# Patient Record
Sex: Male | Born: 1950 | Race: White | Hispanic: No | Marital: Married | Smoking: Former smoker
Health system: Southern US, Community
[De-identification: ages and names within clinical notes are randomized; demographics above are authoritative.]

## PROBLEM LIST (undated history)

## (undated) DIAGNOSIS — E785 Hyperlipidemia, unspecified: Secondary | ICD-10-CM

## (undated) DIAGNOSIS — H6123 Impacted cerumen, bilateral: Secondary | ICD-10-CM

## (undated) DIAGNOSIS — G4730 Sleep apnea, unspecified: Secondary | ICD-10-CM

## (undated) DIAGNOSIS — M20029 Boutonniere deformity of unspecified finger(s): Secondary | ICD-10-CM

## (undated) DIAGNOSIS — D123 Benign neoplasm of transverse colon: Secondary | ICD-10-CM

## (undated) DIAGNOSIS — H9319 Tinnitus, unspecified ear: Secondary | ICD-10-CM

## (undated) DIAGNOSIS — G709 Myoneural disorder, unspecified: Secondary | ICD-10-CM

## (undated) DIAGNOSIS — J45909 Unspecified asthma, uncomplicated: Secondary | ICD-10-CM

## (undated) DIAGNOSIS — Z8619 Personal history of other infectious and parasitic diseases: Secondary | ICD-10-CM

## (undated) HISTORY — DX: Benign neoplasm of transverse colon: D12.3

## (undated) HISTORY — DX: Hyperlipidemia, unspecified: E78.5

## (undated) HISTORY — DX: Boutonniere deformity of unspecified finger(s): M20.029

## (undated) HISTORY — DX: Personal history of other infectious and parasitic diseases: Z86.19

## (undated) HISTORY — DX: Myoneural disorder, unspecified: G70.9

## (undated) HISTORY — PX: COLONOSCOPY: SHX174

---
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
## 1992-08-01 DIAGNOSIS — E781 Pure hyperglyceridemia: Secondary | ICD-10-CM | POA: Insufficient documentation

## 2004-02-01 ENCOUNTER — Ambulatory Visit: Payer: Self-pay | Admitting: General Surgery

## 2004-02-20 ENCOUNTER — Ambulatory Visit: Payer: Self-pay | Admitting: Family Medicine

## 2007-07-25 ENCOUNTER — Ambulatory Visit: Payer: Self-pay | Admitting: Family Medicine

## 2009-02-08 ENCOUNTER — Ambulatory Visit: Payer: Self-pay | Admitting: General Surgery

## 2009-06-01 IMAGING — CR DG CHEST 2V
1 series · 2 of 2 positions shown · non-contrast
Comparison: none

REASON FOR EXAM: Pneumonia
COMMENTS:

PROCEDURE:     KDR - KDXR CHEST PA (OR AP) AND LAT  - July 25, 2007  [DATE]
RESULT:     The lung fields are clear. The heart, mediastinal and osseous
structures show no significant abnormalities. There is degenerative spurring
at multiple levels of the thoracic spine.

[Series 1: view not recorded · 0.17mm/px · 2 of 2 slices shown]
[im 1/2]
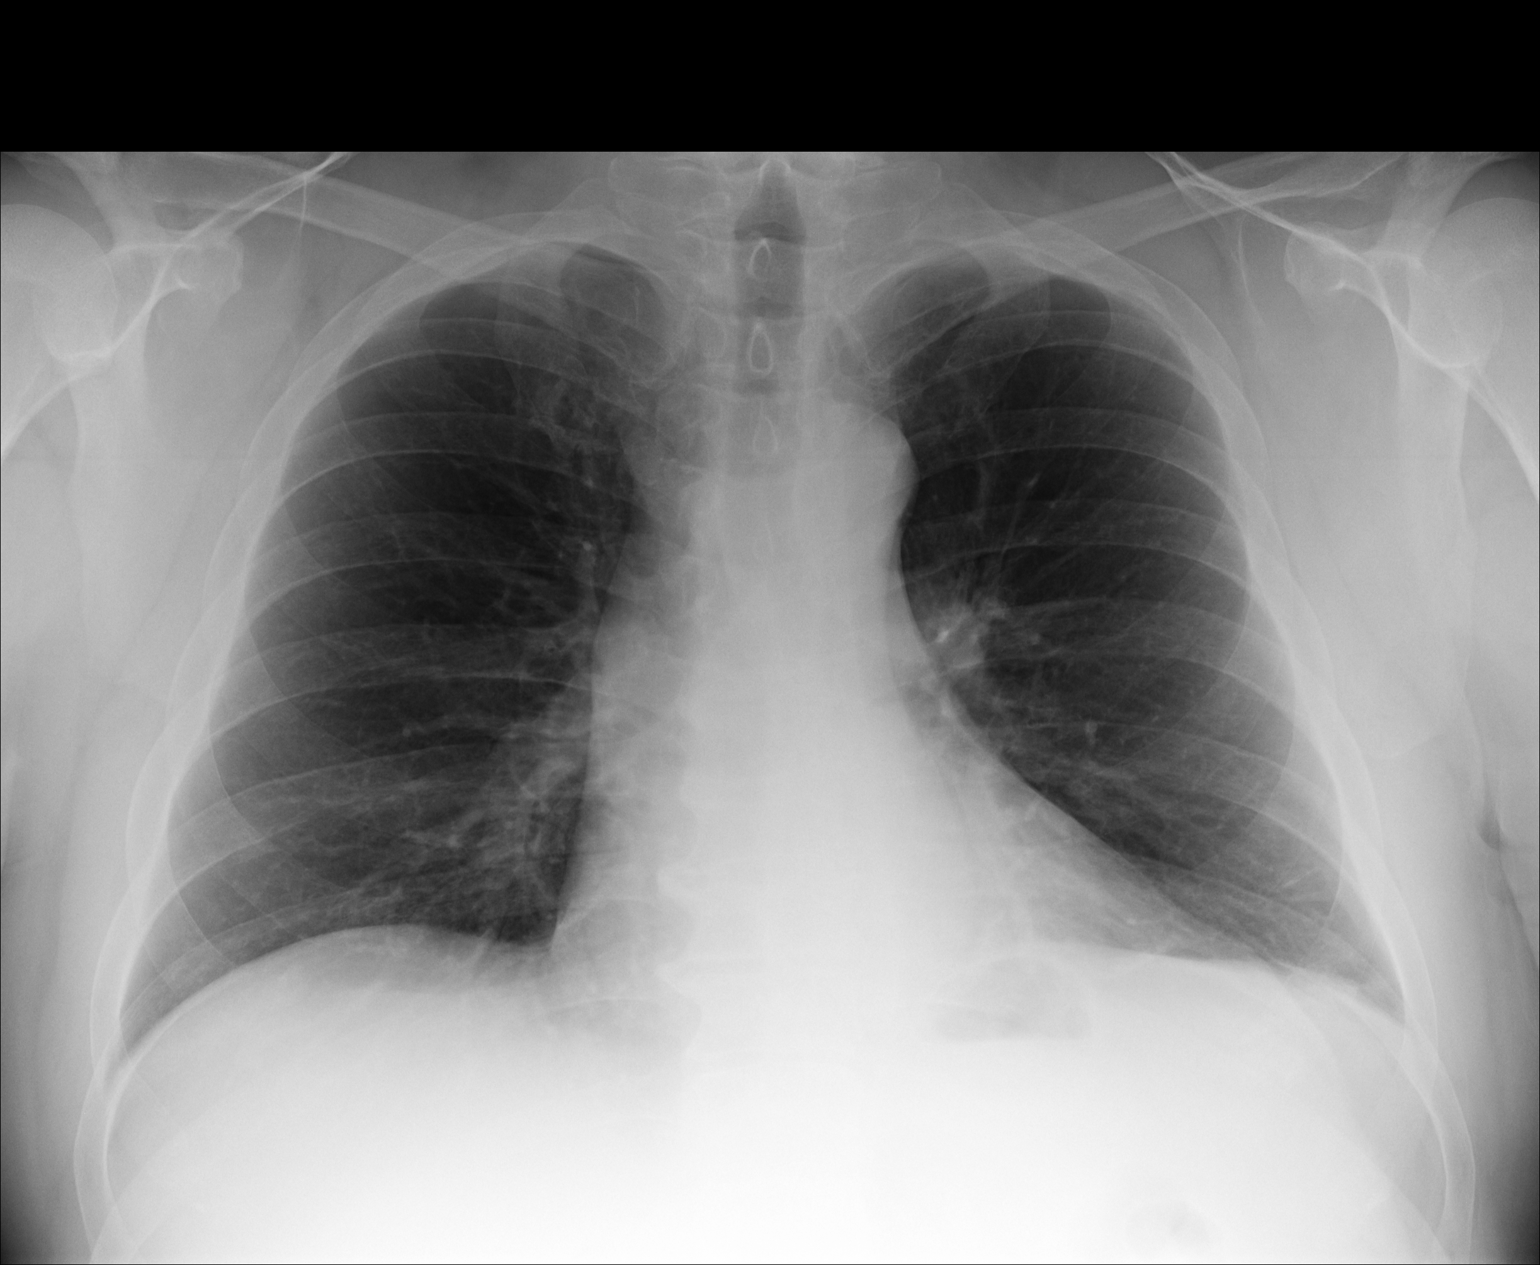
[im 2/2]
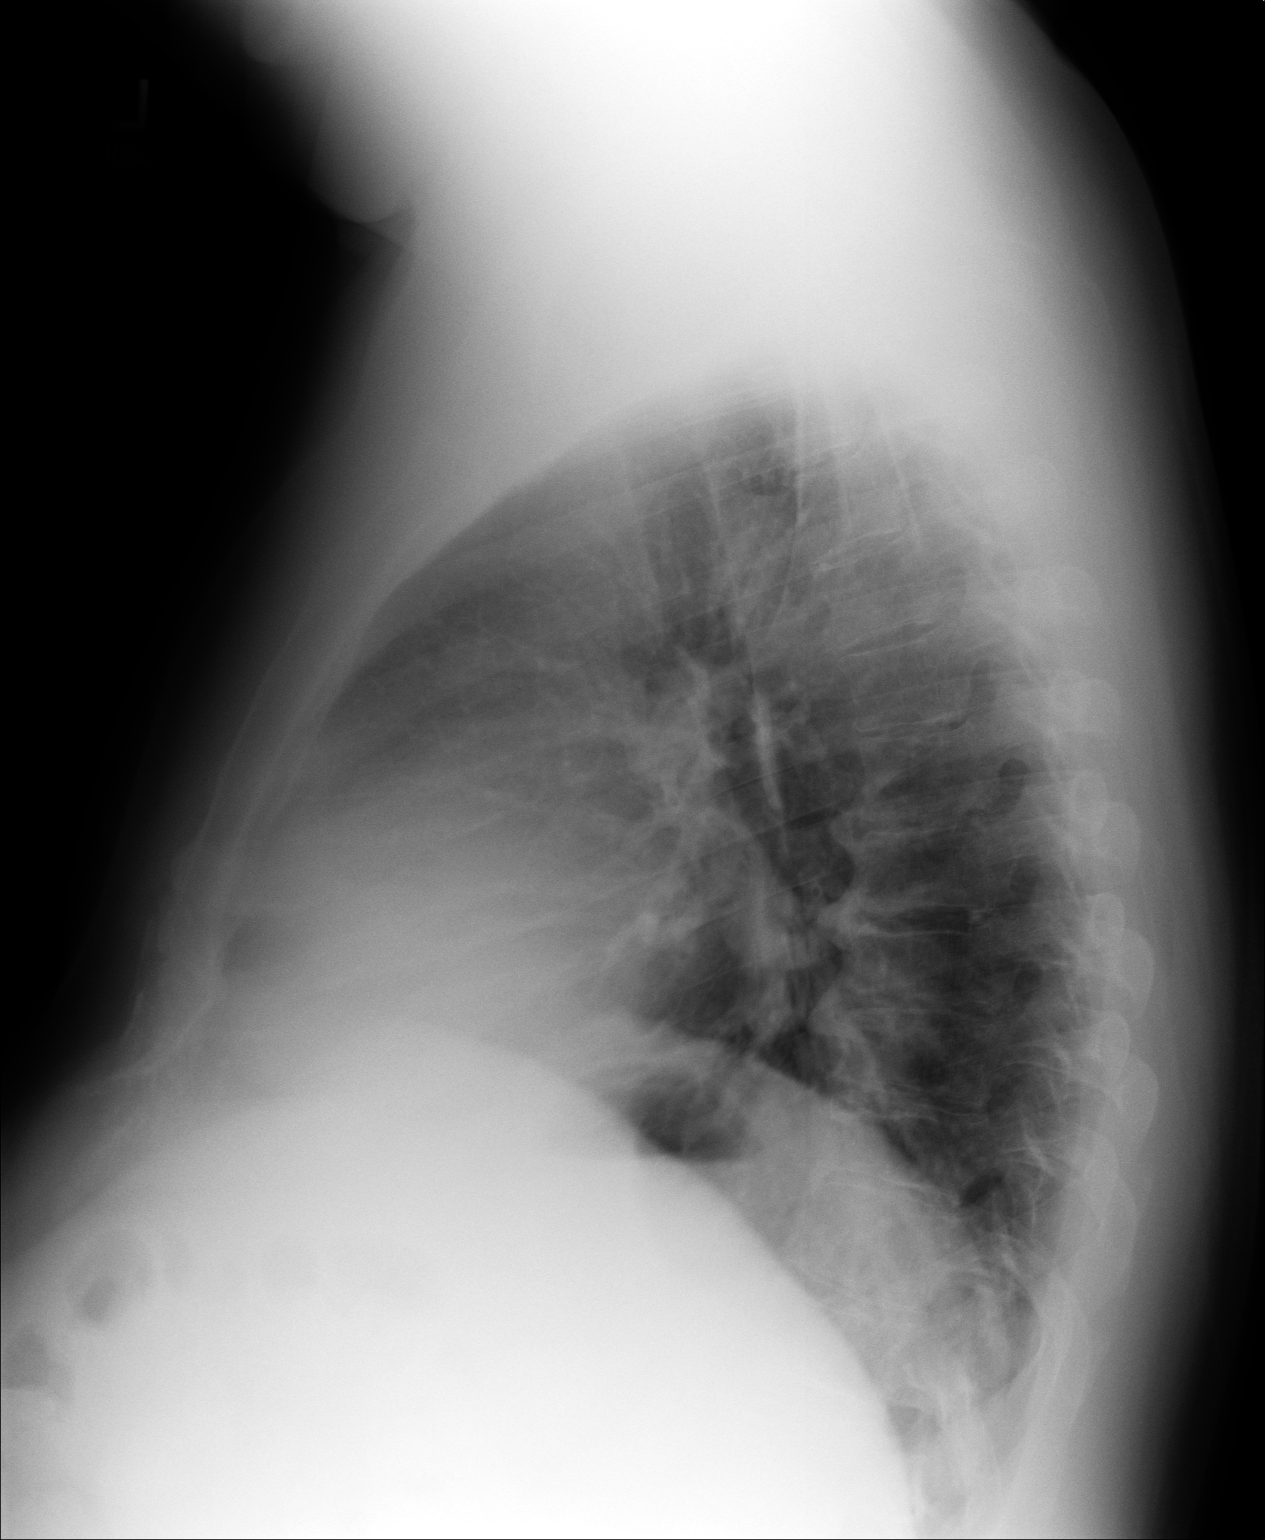

[2 of 2 positions shown; findings below may reference images not displayed]

IMPRESSION: No acute changes are identified.

## 2012-04-28 ENCOUNTER — Telehealth: Payer: Self-pay | Admitting: *Deleted

## 2012-04-28 NOTE — Telephone Encounter (Signed)
Patient called back to confirm no medication changes since last office visit. He is concerned that procedure will not be covered through new insurance. This patient states he has a high deductible. Patient to check with insurance company and let us know if he does not want to proceed.

## 2012-04-28 NOTE — Telephone Encounter (Signed)
I have left message for patient to call the office regarding change in insurance and upcoming colonoscopy that is scheduled for 05-04-12. We just need to verify there have been no medication changes since last office visit. Also, we need to let him know authorization is not required per [facility] for colonoscopy.

## 2012-04-29 ENCOUNTER — Telehealth: Payer: Self-pay | Admitting: *Deleted

## 2012-04-29 NOTE — Telephone Encounter (Signed)
Patient called in today wanting to cancel colonoscopy that was scheduled at [facility] for 05-04-12. This patient states he has a possible job interview and cannot afford to miss it. He will call the office when he is ready to get this rescheduled.

## 2013-04-26 LAB — HM COLONOSCOPY

## 2014-05-09 LAB — TSH: TSH: 2.2 u[IU]/mL (ref 0.41–5.90)

## 2014-05-09 LAB — LIPID PANEL
Cholesterol: 132 mg/dL (ref 0–200)
HDL: 34 mg/dL — AB (ref 35–70)
LDL CALC: 60 mg/dL
Triglycerides: 190 mg/dL — AB (ref 40–160)

## 2014-05-09 LAB — BASIC METABOLIC PANEL
BUN: 16 mg/dL (ref 4–21)
Creatinine: 1.3 mg/dL (ref 0.6–1.3)
GLUCOSE: 87 mg/dL
POTASSIUM: 4.5 mmol/L (ref 3.4–5.3)
Sodium: 143 mmol/L (ref 137–147)

## 2014-11-28 ENCOUNTER — Telehealth: Payer: Self-pay | Admitting: Family Medicine

## 2014-11-28 NOTE — Telephone Encounter (Signed)
Pt states he wants to know when he had his shot last pneumonia vaccine.  CC

## 2014-11-29 NOTE — Telephone Encounter (Signed)
Advised pt that we have no record of him getting a pneumonia vaccine.

## 2015-01-22 ENCOUNTER — Other Ambulatory Visit: Payer: Self-pay | Admitting: Family Medicine

## 2015-02-18 DIAGNOSIS — G4700 Insomnia, unspecified: Secondary | ICD-10-CM | POA: Insufficient documentation

## 2015-02-19 ENCOUNTER — Encounter: Payer: Self-pay | Admitting: Family Medicine

## 2015-02-19 ENCOUNTER — Ambulatory Visit (INDEPENDENT_AMBULATORY_CARE_PROVIDER_SITE_OTHER): Payer: Medicare Other | Admitting: Family Medicine

## 2015-02-19 VITALS — BP 122/70 | HR 88 | Temp 98.2°F | Resp 16

## 2015-02-19 DIAGNOSIS — L409 Psoriasis, unspecified: Secondary | ICD-10-CM | POA: Insufficient documentation

## 2015-02-19 DIAGNOSIS — Z23 Encounter for immunization: Secondary | ICD-10-CM | POA: Diagnosis not present

## 2015-02-19 NOTE — Patient Instructions (Signed)
Apply OTC 1% hydrocortisone as needed

## 2015-02-19 NOTE — Progress Notes (Signed)
       Patient: Earl Crawford Male    DOB: 08/22/1950   65 y.o.   MRN: [id number] Visit Date: 02/19/2015  Today's Provider: Lelon Huh, MD   Chief Complaint  Patient presents with  . Skin Discoloration   Subjective:    HPI Skin check:  Patient comes in stating he has an area that is beet red no the inside of his let butt cheek. Patient denies any pain or itching from this area. His wife notice the spot 2 nights ago as he was bending over in the bathroom. Patient also has an area near his tailbone that itches occasionally and bleeds. Patient states this itchy place has been there intermittenty for several months.      No Known Allergies Previous Medications   ASPIRIN 81 MG TABLET    Take 1 tablet by mouth daily.   ATORVASTATIN (LIPITOR) 20 MG TABLET    Take 1 tablet by mouth daily.   CHOLECALCIFEROL (VITAMIN D3) 5000 UNITS CAPS    Take 1 capsule by mouth daily.   FLUTICASONE (FLONASE) 50 MCG/ACT NASAL SPRAY    Place 2 sprays into both nostrils. 1-2 times a day   MULTIPLE VITAMINS-MINERALS (MULTIVITAMIN ADULT PO)    Take 1 tablet by mouth daily.   OMEGA-3 FATTY ACIDS (FISH OIL) 1200 MG CAPS    Take 1 capsule by mouth daily.   PAROXETINE (PAXIL) 40 MG TABLET    Take 1 tablet by mouth daily.   TRAZODONE (DESYREL) 150 MG TABLET    TAKE ONE-HALF TO ONE TABLET BY MOUTH AT BEDTIME    Review of Systems  Constitutional: Negative for fever, chills and appetite change.  Respiratory: Negative for chest tightness, shortness of breath and wheezing.   Cardiovascular: Negative for chest pain and palpitations.  Gastrointestinal: Negative for nausea, vomiting and abdominal pain.  Skin: Positive for color change and rash.    Social History  Substance Use Topics  . Smoking status: Never Smoker   . Smokeless tobacco: Not on file  . Alcohol Use: 0.0 oz/week    0 Standard drinks or equivalent per week     Comment: occasional use   Objective:   BP 122/70 mmHg  Pulse 88  Temp(Src) 98.2  F (36.8 C) (Oral)  Resp 16  SpO2 96%  Physical Exam  General appearance: alert, well developed, well nourished, cooperative and in no distress  Skin: Patch or psoriasis over sacrum.      Assessment & Plan:     1. Psoriasis   2. Need for pneumococcal vaccination  - Pneumococcal conjugate vaccine 13-valent IM       Lelon Huh, MD  [facility]

## 2015-04-10 DIAGNOSIS — D485 Neoplasm of uncertain behavior of skin: Secondary | ICD-10-CM | POA: Diagnosis not present

## 2015-04-10 DIAGNOSIS — D229 Melanocytic nevi, unspecified: Secondary | ICD-10-CM | POA: Diagnosis not present

## 2015-04-10 DIAGNOSIS — L720 Epidermal cyst: Secondary | ICD-10-CM | POA: Diagnosis not present

## 2015-04-10 DIAGNOSIS — L918 Other hypertrophic disorders of the skin: Secondary | ICD-10-CM | POA: Diagnosis not present

## 2015-04-10 DIAGNOSIS — L719 Rosacea, unspecified: Secondary | ICD-10-CM | POA: Diagnosis not present

## 2015-04-10 DIAGNOSIS — D1800 Hemangioma unspecified site: Secondary | ICD-10-CM | POA: Diagnosis not present

## 2015-04-10 DIAGNOSIS — D692 Other nonthrombocytopenic purpura: Secondary | ICD-10-CM | POA: Diagnosis not present

## 2015-04-10 DIAGNOSIS — L821 Other seborrheic keratosis: Secondary | ICD-10-CM | POA: Diagnosis not present

## 2015-04-10 DIAGNOSIS — Z1283 Encounter for screening for malignant neoplasm of skin: Secondary | ICD-10-CM | POA: Diagnosis not present

## 2015-04-10 DIAGNOSIS — L820 Inflamed seborrheic keratosis: Secondary | ICD-10-CM | POA: Diagnosis not present

## 2015-04-10 DIAGNOSIS — L578 Other skin changes due to chronic exposure to nonionizing radiation: Secondary | ICD-10-CM | POA: Diagnosis not present

## 2015-04-10 DIAGNOSIS — L812 Freckles: Secondary | ICD-10-CM | POA: Diagnosis not present

## 2015-05-01 DIAGNOSIS — H2513 Age-related nuclear cataract, bilateral: Secondary | ICD-10-CM | POA: Diagnosis not present

## 2015-05-13 ENCOUNTER — Encounter: Payer: Self-pay | Admitting: Family Medicine

## 2015-05-13 ENCOUNTER — Ambulatory Visit (INDEPENDENT_AMBULATORY_CARE_PROVIDER_SITE_OTHER): Payer: Medicare Other | Admitting: Family Medicine

## 2015-05-13 VITALS — BP 128/72 | HR 67 | Temp 98.0°F | Resp 16 | Ht 69.5 in | Wt 231.0 lb

## 2015-05-13 DIAGNOSIS — G4733 Obstructive sleep apnea (adult) (pediatric): Secondary | ICD-10-CM | POA: Diagnosis not present

## 2015-05-13 DIAGNOSIS — E669 Obesity, unspecified: Secondary | ICD-10-CM | POA: Diagnosis not present

## 2015-05-13 DIAGNOSIS — F419 Anxiety disorder, unspecified: Secondary | ICD-10-CM | POA: Diagnosis not present

## 2015-05-13 DIAGNOSIS — Z0000 Encounter for general adult medical examination without abnormal findings: Secondary | ICD-10-CM | POA: Diagnosis not present

## 2015-05-13 DIAGNOSIS — Z131 Encounter for screening for diabetes mellitus: Secondary | ICD-10-CM | POA: Diagnosis not present

## 2015-05-13 DIAGNOSIS — E781 Pure hyperglyceridemia: Secondary | ICD-10-CM

## 2015-05-13 DIAGNOSIS — Z125 Encounter for screening for malignant neoplasm of prostate: Secondary | ICD-10-CM | POA: Diagnosis not present

## 2015-05-13 DIAGNOSIS — Z1159 Encounter for screening for other viral diseases: Secondary | ICD-10-CM

## 2015-05-13 DIAGNOSIS — G4700 Insomnia, unspecified: Secondary | ICD-10-CM | POA: Diagnosis not present

## 2015-05-13 NOTE — Progress Notes (Signed)
Patient: Earl Crawford, Male    DOB: November 03, 1950, 65 y.o.   MRN: [id number] Visit Date: 05/13/2015  Today's Provider: Lelon Huh, MD   Chief Complaint  Patient presents with  . Medicare Wellness  . Sleep Apnea    FOLLOW UP  . Hyperlipidemia    FOLLOW UP  . Insomnia    FOLLOW UP   Subjective:   Initial preventative physical exam Earl Crawford is a 65 y.o. male who presents today for his Initial Preventative Physical Exam. He feels well. He reports exercising daily walking the dog for 1 mile. He reports he is sleeping fairly well.   Follow up Obstructive Sleep Apnea:  Last office visit was 1 year ago and no changes were made. Patient was to continue using CPAP consistently. He states he uses CPAP every night and it continues to work well. He feels well rested in the morning and is not sleeping throughout the day.   Follow up insomnia:  Last office visit was 1 year ago and no changes were made. Patient was to continue Trazodone. He states trazodone continues to work well. He also takes paroxetine in the morning which he thinks helps considerable with his anxiety level, which in turn helps him sleep better.     Lipid/Cholesterol, Follow-up:   Last seen for this1 years ago.  Management changes since that visit include none. . Last Lipid Panel:    Component Value Date/Time   CHOL 132 05/09/2014   TRIG 190* 05/09/2014   HDL 34* 05/09/2014   LDLCALC 60 05/09/2014    Risk factors for vascular disease include hypercholesterolemia  He reports good compliance with treatment. He is not having side effects.  Current symptoms include none and have been stable. Weight trend: decreasing steadily Prior visit with dietician: no Current diet: well balanced Current exercise: walking  Wt Readings from Last 3 Encounters:  05/09/14 238 lb (107.956 kg)    -------------------------------------------------------------------   Review of Systems  Constitutional:  Negative for fever, chills, appetite change and fatigue.  HENT: Positive for tinnitus. Negative for congestion, ear pain, hearing loss, nosebleeds and trouble swallowing.   Eyes: Negative for pain and visual disturbance.  Respiratory: Negative for cough, chest tightness and shortness of breath.   Cardiovascular: Negative for chest pain, palpitations and leg swelling.  Gastrointestinal: Negative for nausea, vomiting, abdominal pain, diarrhea, constipation and blood in stool.  Endocrine: Negative for polydipsia, polyphagia and polyuria.  Genitourinary: Negative for dysuria and flank pain.  Musculoskeletal: Positive for arthralgias. Negative for myalgias, back pain, joint swelling and neck stiffness.  Skin: Negative for color change, rash and wound.  Neurological: Positive for numbness (tingling sensation in bottom of right foot and heel) and headaches. Negative for dizziness, tremors, seizures, speech difficulty, weakness and light-headedness.  Psychiatric/Behavioral: Negative for behavioral problems, confusion, sleep disturbance, dysphoric mood and decreased concentration. The patient is not nervous/anxious.   All other systems reviewed and are negative.   Social History   Social History  . Marital Status: Married    Spouse Name: N/A  . Number of Children: 2  . Years of Education: N/A   Occupational History  . 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 Smoking status: Never Smoker   . Smokeless tobacco: Not on file  . Alcohol Use: 0.0 oz/week    0 Standard drinks or equivalent per week     Comment: occasional use  .  Drug Use: No  . Sexual Activity: Not on file   Other Topics Concern  . Not on file   Social History Narrative    Past Medical History  Diagnosis Date  . History of measles   . History of mumps      Patient Active Problem List   Diagnosis Date Noted  . Psoriasis 02/19/2015  . Insomnia 02/18/2015  .  Allergic rhinitis 05/07/2008  . Hypertrophic and atrophic condition of skin 10/21/2006  . Obstructive sleep apnea of adult 12/24/2003  . Tinnitus 11/22/2000  . Pure hyperglyceridemia 08/01/1992    Past Surgical History  Procedure Laterality Date  . Hernia repair  1970    herniorrhapy    His family history includes Diabetes in his other; Heart attack in his father; Hypertension in his father; Macular degeneration in his mother; Obesity in his father. There is no history of Cancer.    Previous Medications   ASPIRIN 81 MG TABLET    Take 1 tablet by mouth daily.   ATORVASTATIN (LIPITOR) 20 MG TABLET    Take 1 tablet by mouth daily.   CHOLECALCIFEROL (VITAMIN D3) 5000 UNITS CAPS    Take 1 capsule by mouth daily.   FLUTICASONE (FLONASE) 50 MCG/ACT NASAL SPRAY    Place 2 sprays into both nostrils. Reported on 05/13/2015   MULTIPLE VITAMINS-MINERALS (MULTIVITAMIN ADULT PO)    Take 1 tablet by mouth daily.   OMEGA-3 FATTY ACIDS (FISH OIL) 1200 MG CAPS    Take 1 capsule by mouth daily.   PAROXETINE (PAXIL) 40 MG TABLET    Take 1 tablet by mouth daily.   TRAZODONE (DESYREL) 150 MG TABLET    TAKE ONE-HALF TO ONE TABLET BY MOUTH AT BEDTIME   VITAMINS-LIPOTROPICS (LIPOFLAVONOID PO)    Take 1 tablet by mouth 3 (three) times daily.    Patient Care Team: Birdie Sons, MD as PCP - General (Family Medicine) Robert Bellow, MD (General Surgery) Roosvelt Harps, OD (Optometry)     Objective:   Vitals: BP 128/72 mmHg  Pulse 67  Temp(Src) 98 F (36.7 C) (Oral)  Resp 16  Ht 5' 9.5" (1.765 m)  Wt 231 lb (104.781 kg)  BMI 33.64 kg/m2  SpO2 98%  Physical Exam   General Appearance:    Alert, cooperative, no distress, appears stated age, overweight.   Head:    Normocephalic, without obvious abnormality, atraumatic  Eyes:    PERRL, conjunctiva/corneas clear, EOM's intact, fundi    benign, both eyes       Ears:    Normal TM's and external ear canals, both ears  Nose:   Nares normal, septum  midline, mucosa normal, no drainage   or sinus tenderness  Throat:   Lips, mucosa, and tongue normal; teeth and gums normal  Neck:   Supple, symmetrical, trachea midline, no adenopathy;       thyroid:  No enlargement/tenderness/nodules; no carotid   bruit or JVD  Back:     Symmetric, no curvature, ROM normal, no CVA tenderness  Lungs:     Clear to auscultation bilaterally, respirations unlabored  Chest wall:    No tenderness or deformity  Heart:    Regular rate and rhythm, S1 and S2 normal, no murmur, rub   or gallop  Abdomen:     Soft, non-tender, bowel sounds active all four quadrants,    no masses, no organomegaly  Genitalia:    deferred  Rectal:    deferred  Extremities:   Extremities  normal, atraumatic, no cyanosis or edema  Pulses:   2+ and symmetric all extremities  Skin:   Skin color, texture, turgor normal, no rashes or lesions  Lymph nodes:   Cervical, supraclavicular, and axillary nodes normal  Neurologic:   CNII-XII intact. Normal strength, sensation and reflexes      throughout      Visual Acuity Screening   Right eye Left eye Both eyes  Without correction: 20/70 20/70 20/70   With correction: 20/25 20/20 20/20   Comments: Saw all colors   Activities of Daily Living In your present state of health, do you have any difficulty performing the following activities: 05/13/2015  Hearing? Y  Vision? N  Difficulty concentrating or making decisions? N  Walking or climbing stairs? N  Dressing or bathing? N  Doing errands, shopping? N    Fall Risk Assessment Fall Risk  05/13/2015  Falls in the past year? No     Depression Screen PHQ 2/9 Scores 05/13/2015  PHQ - 2 Score 0  PHQ- 9 Score 1    Cognitive Testing - 6-CIT  Correct? Score   What year is it? yes 0 0 or 4  What month is it? yes 0 0 or 3  Memorize:    Pia Mau,  42,  [place],      What time is it? (within 1 hour) yes 0 0 or 3  Count backwards from 20 yes 0 0, 2, or 4  Name the months of  the year yes 0 0, 2, or 4  Repeat name & address above yes 0 0, 2, 4, 6, 8, or 10       TOTAL SCORE  0/28   Interpretation:  Normal  Normal (0-7) Abnormal (8-28)    Audit-C Alcohol Use Screening  Question Answer Points  How often do you have alcoholic drink? 2-4 times monthly 2  On days you do drink alcohol, how many drinks do you typically consume? 1 or 2 0  How oftey will you drink 6 or more in a total? never 0  Total Score:  2   A score of 3 or more in women, and 4 or more in men indicates increased risk for alcohol abuse, EXCEPT if all of the points are from question 1.   Current Exercise Habits: Home exercise routine, Type of exercise: walking, Time (Minutes): 30, Frequency (Times/Week): 7, Weekly Exercise (Minutes/Week): 210, Intensity: Mild Exercise limited by: None identified   Assessment & Plan:     Initial Preventative Physical Exam  Reviewed patient's Family Medical History Reviewed and updated list of patient's medical providers Assessment of cognitive impairment was done Assessed patient's functional ability Established a written schedule for health screening Corinne Completed and Reviewed  Exercise Activities and Dietary recommendations Goals    None      Immunization History  Administered Date(s) Administered  . Influenza-Unspecified 11/17/2014  . Pneumococcal Conjugate-13 02/19/2015  . Td 01/23/2000  . Tdap 05/04/2008  . Zoster 02/27/2010    Health Maintenance  Topic Date Due  . Hepatitis C Screening  [birth year]  . HIV Screening  02/10/1965  . INFLUENZA VACCINE  08/20/2015  . PNA vac Low Risk Adult (2 of 2 - PPSV23) 02/19/2016  . COLONOSCOPY  04/26/2016  . TETANUS/TDAP  05/05/2018  . ZOSTAVAX  Completed      Discussed health benefits of physical activity, and encouraged him to engage in regular exercise appropriate for his age and condition.      ------------------------------------------------------------------------------------------------------------  1. Encounter for initial preventive physical examination covered by Medicare Generally doing well with no new medical problems  - EKG 12-Lead  2. Pure hyperglyceridemia He is tolerating atorvastatin well with no adverse effects.   - Hepatic function panel - Lipid panel  3. Obstructive sleep apnea of adult Well controlled and compliant with CPAP  4. Obesity Diet and exercise.   5. Insomnia Trazodone remains effective.   6. Anxiety Doing well with paroxetine.   7. Diabetes mellitus screening  - Glucose  8. Prostate cancer screening  - PSA  9. Need for hepatitis C screening test  - Hepatitis C antibody

## 2015-05-14 ENCOUNTER — Telehealth: Payer: Self-pay | Admitting: Family Medicine

## 2015-05-14 LAB — HEPATIC FUNCTION PANEL
ALT: 20 IU/L (ref 0–44)
AST: 20 IU/L (ref 0–40)
Albumin: 4.7 g/dL (ref 3.6–4.8)
Alkaline Phosphatase: 42 IU/L (ref 39–117)
BILIRUBIN, DIRECT: 0.17 mg/dL (ref 0.00–0.40)
Bilirubin Total: 0.6 mg/dL (ref 0.0–1.2)
Total Protein: 6.8 g/dL (ref 6.0–8.5)

## 2015-05-14 LAB — LIPID PANEL
CHOLESTEROL TOTAL: 128 mg/dL (ref 100–199)
Chol/HDL Ratio: 3.7 ratio units (ref 0.0–5.0)
HDL: 35 mg/dL — ABNORMAL LOW (ref >39)
LDL Calculated: 58 mg/dL (ref 0–99)
Triglycerides: 176 mg/dL — ABNORMAL HIGH (ref 0–149)
VLDL Cholesterol Cal: 35 mg/dL (ref 5–40)

## 2015-05-14 LAB — PSA: PROSTATE SPECIFIC AG, SERUM: 0.4 ng/mL (ref 0.0–4.0)

## 2015-05-14 LAB — HEPATITIS C ANTIBODY

## 2015-05-14 LAB — GLUCOSE, RANDOM: GLUCOSE: 94 mg/dL (ref 65–99)

## 2015-05-14 NOTE — Telephone Encounter (Signed)
Pt stated he spoke with Sharyn Lull about his results and he has another question and would like a call back. Thanks TNP

## 2015-05-14 NOTE — Telephone Encounter (Signed)
Returned pt's call. Patient wanted to go over cholesterol numbers.

## 2015-05-19 ENCOUNTER — Other Ambulatory Visit: Payer: Self-pay | Admitting: Family Medicine

## 2015-05-27 ENCOUNTER — Other Ambulatory Visit: Payer: Self-pay | Admitting: Family Medicine

## 2015-10-21 DIAGNOSIS — Z23 Encounter for immunization: Secondary | ICD-10-CM | POA: Diagnosis not present

## 2015-11-16 ENCOUNTER — Other Ambulatory Visit: Payer: Self-pay | Admitting: Family Medicine

## 2016-04-03 ENCOUNTER — Other Ambulatory Visit: Payer: Self-pay

## 2016-04-03 MED ORDER — ATORVASTATIN CALCIUM 20 MG PO TABS
20.0000 mg | ORAL_TABLET | Freq: Every day | ORAL | 0 refills | Status: DC
Start: 2016-04-03 — End: 2016-09-21

## 2016-04-03 NOTE — Telephone Encounter (Addendum)
Pharmacy requesting 90 day supply

## 2016-04-23 ENCOUNTER — Telehealth: Payer: Self-pay | Admitting: Family Medicine

## 2016-04-23 NOTE — Telephone Encounter (Signed)
Called Pt to schedule AWV with [facility] - knb °

## 2016-05-13 ENCOUNTER — Other Ambulatory Visit: Payer: Self-pay | Admitting: Family Medicine

## 2016-05-18 ENCOUNTER — Ambulatory Visit (INDEPENDENT_AMBULATORY_CARE_PROVIDER_SITE_OTHER): Payer: Medicare Other | Admitting: Family Medicine

## 2016-05-18 ENCOUNTER — Encounter: Payer: Self-pay | Admitting: Family Medicine

## 2016-05-18 VITALS — BP 120/70 | HR 63 | Temp 99.2°F | Resp 16 | Ht 70.0 in | Wt 238.0 lb

## 2016-05-18 DIAGNOSIS — G4733 Obstructive sleep apnea (adult) (pediatric): Secondary | ICD-10-CM

## 2016-05-18 DIAGNOSIS — Z23 Encounter for immunization: Secondary | ICD-10-CM

## 2016-05-18 DIAGNOSIS — Z9989 Dependence on other enabling machines and devices: Secondary | ICD-10-CM | POA: Diagnosis not present

## 2016-05-18 DIAGNOSIS — Z125 Encounter for screening for malignant neoplasm of prostate: Secondary | ICD-10-CM

## 2016-05-18 DIAGNOSIS — Z0000 Encounter for general adult medical examination without abnormal findings: Secondary | ICD-10-CM

## 2016-05-18 DIAGNOSIS — Z86010 Personal history of colonic polyps: Secondary | ICD-10-CM | POA: Diagnosis not present

## 2016-05-18 DIAGNOSIS — Z860101 Personal history of adenomatous and serrated colon polyps: Secondary | ICD-10-CM | POA: Insufficient documentation

## 2016-05-18 DIAGNOSIS — E781 Pure hyperglyceridemia: Secondary | ICD-10-CM

## 2016-05-18 DIAGNOSIS — E669 Obesity, unspecified: Secondary | ICD-10-CM

## 2016-05-18 DIAGNOSIS — Z6833 Body mass index (BMI) 33.0-33.9, adult: Secondary | ICD-10-CM | POA: Diagnosis not present

## 2016-05-18 DIAGNOSIS — H6123 Impacted cerumen, bilateral: Secondary | ICD-10-CM

## 2016-05-18 DIAGNOSIS — G4700 Insomnia, unspecified: Secondary | ICD-10-CM

## 2016-05-18 NOTE — Progress Notes (Signed)
Patient: Earl Crawford, Male    DOB: April 17, 1950, 66 y.o.   MRN: [id number] Visit Date: 05/18/2016  Today's Provider: Lelon Huh, MD   Chief Complaint  Patient presents with  . Medicare Wellness  . Anxiety  . Sleep Apnea  . Insomnia   Subjective:    Annual wellness visit Earl Crawford is a 66 y.o. male. He feels well. He reports exercising; no. He reports he is sleeping fairly well.  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  -----------------------------------------------------------  Pure hyperglyceridemia Lab Results  Component Value Date   CHOL 128 05/13/2015   HDL 35 (L) 05/13/2015   LDLCALC 58 05/13/2015   TRIG 176 (H) 05/13/2015   CHOLHDL 3.7 05/13/2015   Is taking atorvastatin every day and tolerating well.    Obstructive sleep apnea of adult He has been using the same CPAP machine for 15 years and feels like it is not working as well. The mask leaks and he does not feel as well rested.    Insomnia From 05/13/2015-no changes.States that trazodone continues to work well with no lingering sedation in the morning, usually take 1/2 but sometimes a full tablet.   Anxiety Feels that paroxeiting is working well without adverse effect.      Review of Systems  Constitutional: Negative.  Negative for chills, diaphoresis and fever.  HENT: Positive for hearing loss and tinnitus. Negative for congestion, ear discharge, ear pain, nosebleeds and sore throat.   Eyes: Negative for photophobia, pain, discharge and redness.  Respiratory: Positive for apnea. Negative for cough, shortness of breath, wheezing and stridor.   Cardiovascular: Negative for chest pain, palpitations and leg swelling.  Gastrointestinal: Negative for abdominal pain, blood in stool, constipation, diarrhea, nausea and vomiting.  Endocrine: Negative for polydipsia.  Genitourinary: Negative for dysuria, flank pain, frequency, hematuria and  urgency.  Musculoskeletal: Negative for back pain, myalgias and neck pain.  Skin: Negative for rash.  Allergic/Immunologic: Negative for environmental allergies.  Neurological: Negative for dizziness, tremors, seizures, weakness and headaches.  Hematological: Does not bruise/bleed easily.  Psychiatric/Behavioral: Negative for hallucinations and suicidal ideas. The patient is not nervous/anxious.   All other systems reviewed and are negative.   Social History   Social History  . Marital status: Married    Spouse name: N/A  . Number of children: 4  . Years of education: N/A   Occupational History  . 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 Smoking status: Never Smoker  . Smokeless tobacco: Never Used  . Alcohol use 0.0 oz/week     Comment: occasional use  . Drug use: No  . Sexual activity: Not on file   Other Topics Concern  . Not on file   Social History Narrative  . No narrative on file    Past Medical History:  Diagnosis Date  . History of measles   . History of mumps      Patient Active Problem List   Diagnosis Date Noted  . History of adenomatous polyp of colon 05/18/2016  . Obesity 05/13/2015  . Anxiety 05/13/2015  . Psoriasis 02/19/2015  . Insomnia 02/18/2015  . Allergic rhinitis 05/07/2008  . Hypertrophic and atrophic condition of skin 10/21/2006  . OSA on CPAP 12/24/2003  . Tinnitus 11/22/2000  . Pure hyperglyceridemia 08/01/1992    Past Surgical History:  Procedure Laterality Date  .  HERNIA REPAIR  1970   herniorrhapy    His family history includes Diabetes in his other; Heart attack in his father; Hypertension in his father; Macular degeneration in his mother; Obesity in his father.      Current Outpatient Prescriptions:  .  aspirin 81 MG tablet, Take 1 tablet by mouth daily., Disp: , Rfl:  .  atorvastatin (LIPITOR) 20 MG tablet, Take 1 tablet (20 mg total) by mouth daily., Disp: 90 tablet,  Rfl: 0 .  Cholecalciferol (VITAMIN D3) 5000 units CAPS, Take 1 capsule by mouth daily., Disp: , Rfl:  .  Hypromellose (ALZAIR ALLERGY NASAL SPRAY NA), Place into the nose., Disp: , Rfl:  .  Multiple Vitamins-Minerals (MULTIVITAMIN ADULT PO), Take 1 tablet by mouth daily., Disp: , Rfl:  .  Omega-3 Fatty Acids (FISH OIL) 1200 MG CAPS, Take 1 capsule by mouth daily., Disp: , Rfl:  .  PARoxetine (PAXIL) 40 MG tablet, TAKE ONE TABLET BY MOUTH ONCE DAILY, Disp: 90 tablet, Rfl: 3 .  traZODone (DESYREL) 150 MG tablet, TAKE ONE-HALF TO ONE TABLET BY MOUTH AT BEDTIME, Disp: 90 tablet, Rfl: 3 .  Vitamins-Lipotropics (LIPOFLAVONOID PO), Take 1 tablet by mouth 3 (three) times daily., Disp: , Rfl:   Patient Care Team: Birdie Sons, MD as PCP - General (Family Medicine) Robert Bellow, MD (General Surgery) Roosvelt Harps, OD (Optometry)     Objective:   Vitals: BP 120/70 (BP Location: Right Arm, Patient Position: Sitting, Cuff Size: Large)   Pulse 63   Temp 99.2 F (37.3 C) (Oral)   Resp 16   Ht 5\' 10"  (1.778 m)   Wt 238 lb (108 kg)   SpO2 98%   BMI 34.15 kg/m   Physical Exam   General Appearance:    Alert, cooperative, no distress, appears stated age, obese  Head:    Normocephalic, without obvious abnormality, atraumatic  Eyes:    PERRL, conjunctiva/corneas clear, EOM's intact, fundi    benign, both eyes       Ears:    Both canals obstructed with cerumen.   Nose:   Nares normal, septum midline, mucosa normal, no drainage   or sinus tenderness  Throat:   Lips, mucosa, and tongue normal; teeth and gums normal  Neck:   Supple, symmetrical, trachea midline, no adenopathy;       thyroid:  No enlargement/tenderness/nodules; no carotid   bruit or JVD  Back:     Symmetric, no curvature, ROM normal, no CVA tenderness  Lungs:     Clear to auscultation bilaterally, respirations unlabored  Chest wall:    No tenderness or deformity  Heart:    Regular rate and rhythm, S1 and S2 normal, no  murmur, rub   or gallop  Abdomen:     Soft, non-tender, bowel sounds active all four quadrants,    no masses, no organomegaly  Genitalia:    deferred  Rectal:    deferred  Extremities:   Extremities normal, atraumatic, no cyanosis or edema  Pulses:   2+ and symmetric all extremities  Skin:   Skin color, texture, turgor normal, no rashes or lesions  Lymph nodes:   Cervical, supraclavicular, and axillary nodes normal  Neurologic:   CNII-XII intact. Normal strength, sensation and reflexes      throughout    Activities of Daily Living In your present state of health, do you have any difficulty performing the following activities: 05/18/2016  Hearing? Y  Vision? N  Difficulty concentrating or making  decisions? N  Walking or climbing stairs? N  Dressing or bathing? N  Doing errands, shopping? N  Some recent data might be hidden    Fall Risk Assessment Fall Risk  05/18/2016 05/13/2015  Falls in the past year? No No     Depression Screen PHQ 2/9 Scores 05/18/2016 05/13/2015  PHQ - 2 Score 0 0  PHQ- 9 Score 0 1    Cognitive Testing - 6-CIT  Correct? Score   What year is it? yes 0 0 or 4  What month is it? yes 0 0 or 3  Memorize:    Pia Mau,  42,  [address]  [place],      What time is it? (within 1 hour) yes 0 0 or 3  Count backwards from 20 yes 0 0, 2, or 4  Name the months of the year yes 0 0, 2, or 4  Repeat name & address above yes 0 0, 2, 4, 6, 8, or 10       TOTAL SCORE  0/28   Interpretation:  Normal  Normal (0-7) Abnormal (8-28)    Audit-C Alcohol Use Screening  Question Answer Points  How often do you have alcoholic drink? never 0  On days you do drink alcohol, how many drinks do you typically consume? 0 0  How oftey will you drink 6 or more in a total? never 0  Total Score:  0   A score of 3 or more in women, and 4 or more in men indicates increased risk for alcohol abuse, EXCEPT if all of the points are from question 1.     Assessment & Plan:      Annual Wellness Visit  Reviewed patient's Family Medical History Reviewed and updated list of patient's medical providers Assessment of cognitive impairment was done Assessed patient's functional ability Established a written schedule for health screening [place] Completed and Reviewed  Exercise Activities and Dietary recommendations Goals    None      Immunization History  Administered Date(s) Administered  . Influenza-Unspecified 11/17/2014  . Pneumococcal Conjugate-13 02/19/2015  . Td 01/23/2000  . Tdap 05/04/2008  . Zoster 02/27/2010    Health Maintenance  Topic Date Due  . PNA vac Low Risk Adult (2 of 2 - PPSV23) 02/19/2016  . COLONOSCOPY  04/26/2016  . INFLUENZA VACCINE  08/19/2016  . TETANUS/TDAP  05/05/2018  . Hepatitis C Screening  Completed     Discussed health benefits of physical activity, and encouraged him to engage in regular exercise appropriate for his age and condition.    --------------------------------------------------------------------------  1. Medicare annual wellness visit, subsequent Generally doing well. Recommend he talk to his   2. History of adenomatous polyp of colon GI in [place] recommend follow up colonoscopy in 2018 after his last colonoscopy in 04/2013.   - Ambulatory referral to Gastroenterology  3. Pure hyperglyceridemia He is tolerating atorvastatin well with no adverse effects.   - Comprehensive metabolic panel - Lipid panel  4. OSA on CPAP Needs follow up sleep study as current equipment does not seem to control sx anymore.   5. Class 1 obesity without serious comorbidity with body mass index (BMI) of 33.0 to 33.9 in adult, unspecified obesity type D&E  6. Insomnia, unspecified type Continue prn trazodone  7. Need for pneumococcal vaccination - Pneumococcal polysaccharide vaccine 23-valent greater than or equal to 2yo subcutaneous/IM  8. Prostate cancer screening  - PSA  9. Bilateral  impacted cerumen After  soaking with Debrox, ear canal was irrigated with water until clear. Patient tolerated procedure well.  He reports significant improvement in hearing after ear irrigation.   The entirety of the information documented in the History of Present Illness, Review of Systems and Physical Exam were personally obtained by me. Portions of this information were initially documented by April M. Sabra Heck, CMA and reviewed by me for thoroughness and accuracy.    Lelon Huh, MD  [facility]

## 2016-05-18 NOTE — Patient Instructions (Signed)
Check with pharmacist regarding availability of Shinrix vaccine for shingles prevention.

## 2016-05-19 LAB — LIPID PANEL
CHOL/HDL RATIO: 4.2 ratio (ref 0.0–5.0)
Cholesterol, Total: 138 mg/dL (ref 100–199)
HDL: 33 mg/dL — ABNORMAL LOW (ref >39)
LDL CALC: 48 mg/dL (ref 0–99)
Triglycerides: 284 mg/dL — ABNORMAL HIGH (ref 0–149)
VLDL Cholesterol Cal: 57 mg/dL — ABNORMAL HIGH (ref 5–40)

## 2016-05-19 LAB — COMPREHENSIVE METABOLIC PANEL
ALBUMIN: 4.6 g/dL (ref 3.6–4.8)
ALK PHOS: 43 IU/L (ref 39–117)
ALT: 29 IU/L (ref 0–44)
AST: 24 IU/L (ref 0–40)
Albumin/Globulin Ratio: 2.3 — ABNORMAL HIGH (ref 1.2–2.2)
BUN / CREAT RATIO: 18 (ref 10–24)
BUN: 19 mg/dL (ref 8–27)
Bilirubin Total: 0.8 mg/dL (ref 0.0–1.2)
CALCIUM: 9.5 mg/dL (ref 8.6–10.2)
CO2: 25 mmol/L (ref 18–29)
CREATININE: 1.03 mg/dL (ref 0.76–1.27)
Chloride: 103 mmol/L (ref 96–106)
GFR calc Af Amer: 87 mL/min/{1.73_m2} (ref >59)
GFR, EST NON AFRICAN AMERICAN: 75 mL/min/{1.73_m2} (ref >59)
GLOBULIN, TOTAL: 2 g/dL (ref 1.5–4.5)
Glucose: 83 mg/dL (ref 65–99)
Potassium: 4.2 mmol/L (ref 3.5–5.2)
SODIUM: 143 mmol/L (ref 134–144)
Total Protein: 6.6 g/dL (ref 6.0–8.5)

## 2016-05-19 LAB — PSA: Prostate Specific Ag, Serum: 0.4 ng/mL (ref 0.0–4.0)

## 2016-05-22 ENCOUNTER — Telehealth: Payer: Self-pay

## 2016-05-22 ENCOUNTER — Telehealth: Payer: Self-pay | Admitting: Gastroenterology

## 2016-05-22 NOTE — Telephone Encounter (Signed)
Patient called back to schedule procedure. He is off on Monday please call him then. Thanks!

## 2016-05-22 NOTE — Telephone Encounter (Signed)
Patient called wanting to know lab results. Please review. Thanks!

## 2016-05-25 ENCOUNTER — Telehealth: Payer: Self-pay | Admitting: *Deleted

## 2016-05-25 ENCOUNTER — Other Ambulatory Visit: Payer: Self-pay

## 2016-05-25 DIAGNOSIS — Z86010 Personal history of colonic polyps: Secondary | ICD-10-CM

## 2016-05-25 NOTE — Telephone Encounter (Signed)
Patient was inquiring about CPAP equipment. Patient states that he discussed possibly getting new a CPAP at his cpe last week. Please advise?

## 2016-05-25 NOTE — Progress Notes (Signed)
Gastroenterology Pre-Procedure Review  Request Date: Mon May 21st, 2018 Requesting Physician: Dr. Allen Norris  PATIENT REVIEW QUESTIONS: The patient responded to the following health history questions as indicated:    1. Are you having any GI issues? no 2. Do you have a personal history of Polyps? yes (Self) 3. Do you have a family history of Colon Cancer or Polyps? no 4. Diabetes Mellitus? no 5. Joint replacements in the past 12 months?no 6. Major health problems in the past 3 months?no 7. Any artificial heart valves, MVP, or defibrillator?no    MEDICATIONS & ALLERGIES:    Patient reports the following regarding taking any anticoagulation/antiplatelet therapy:   Plavix, Coumadin, Eliquis, Xarelto, Lovenox, Pradaxa, Brilinta, or Effient? no Aspirin? yes  Patient confirms/reports the following medications:  Current Outpatient Prescriptions  Medication Sig Dispense Refill  . aspirin 81 MG tablet Take 1 tablet by mouth daily.    Marland Kitchen atorvastatin (LIPITOR) 20 MG tablet Take 1 tablet (20 mg total) by mouth daily. 90 tablet 0  . Cholecalciferol (VITAMIN D3) 5000 units CAPS Take 1 capsule by mouth daily.    . Hypromellose (ALZAIR ALLERGY NASAL SPRAY NA) Place into the nose.    . Multiple Vitamins-Minerals (MULTIVITAMIN ADULT PO) Take 1 tablet by mouth daily.    . Omega-3 Fatty Acids (FISH OIL) 1200 MG CAPS Take 1 capsule by mouth daily.    Marland Kitchen PARoxetine (PAXIL) 40 MG tablet TAKE ONE TABLET BY MOUTH ONCE DAILY 90 tablet 3  . traZODone (DESYREL) 150 MG tablet TAKE ONE-HALF TO ONE TABLET BY MOUTH AT BEDTIME 90 tablet 3  . Vitamins-Lipotropics (LIPOFLAVONOID PO) Take 1 tablet by mouth 3 (three) times daily.     No current facility-administered medications for this visit.     Patient confirms/reports the following allergies:  No Known Allergies  No orders of the defined types were placed in this encounter.   AUTHORIZATION INFORMATION Primary Insurance: 1D#: Group #:  Secondary  Insurance: 1D#: Group #:  SCHEDULE INFORMATION: Date: Mon May 21st Dr. Allen Norris  Time: Location:[facility]

## 2016-05-25 NOTE — Telephone Encounter (Signed)
Can you please check the status of the sleep study that was ordered 05-18-2016

## 2016-05-26 ENCOUNTER — Telehealth: Payer: Self-pay | Admitting: Gastroenterology

## 2016-05-26 NOTE — Telephone Encounter (Signed)
05/26/16 MCR does NOT require prior Auth. Faxed Prior Auth form to [facility] / [id number].

## 2016-05-26 NOTE — Telephone Encounter (Signed)
Pt advised order for home sleep study faxed to [facility].Their office will contact pt

## 2016-06-01 ENCOUNTER — Telehealth: Payer: Self-pay

## 2016-06-01 NOTE — Telephone Encounter (Signed)
Pt was originally scheduled screening colonoscopy May 21st, 2018 at [facility] with Dr. Allen Norris.  He has requested date change to June 4th. Which we were able to accommodate. I rescheduled at [facility] with Maudie Mercury to June 4th.

## 2016-06-16 ENCOUNTER — Telehealth: Payer: Self-pay | Admitting: Family Medicine

## 2016-06-16 NOTE — Telephone Encounter (Signed)
Pt stated that he spoke with someone at [facility] (where pt gets his CPAP supplies) and they need [place] to fax them orders for an in home sleep study and a new CPAP machine. [facility]'s fax # [phone]. Pt stated that he discussed this with Dr. Caryn Section at his CPE visit on 05/18/16. Please advise. Thanks TNP

## 2016-06-16 NOTE — Telephone Encounter (Signed)
Please clarify. Do they need an order for the sleep study while wearing CPAP? I think he already has a CPAP machine.

## 2016-06-16 NOTE — Telephone Encounter (Signed)
Please review. KW 

## 2016-06-17 ENCOUNTER — Telehealth: Payer: Self-pay | Admitting: Gastroenterology

## 2016-06-17 NOTE — Telephone Encounter (Signed)
LMOVM for pt to return call 

## 2016-06-17 NOTE — Telephone Encounter (Signed)
Patient left a voice message that he needs to move his colonoscopy from June 4 to June 18. Please leave him a message to confirm this because he can't answer his cell phone at work.

## 2016-06-17 NOTE — Telephone Encounter (Signed)
Patient confirmed that he does have a CPAP machine. He states he needs an order for a sleep study to determine if he still needs to use the CPAP machine. The sleep study will be done without using a CPAP machine.  If the sleep study reveals that he does need to still use the CPAP machine, then he needs a new order for CPAP machine also. Patient is requesting an order for both a sleep study and an order for CPAP (just in case he still needs it).

## 2016-06-17 NOTE — Telephone Encounter (Signed)
Notified Kim in [place] to move colonoscoppy from 06/22/16 to 07/06/16 with Dr. Allen Norris still at [facility].  LVM for pt to let him know his appt has been moved as he has requested.

## 2016-06-30 ENCOUNTER — Encounter: Payer: Self-pay | Admitting: *Deleted

## 2016-07-03 NOTE — Discharge Instructions (Signed)
General Anesthesia, Adult, Care After °These instructions provide you with information about caring for yourself after your procedure. Your health care provider may also give you more specific instructions. Your treatment has been planned according to current medical practices, but problems sometimes occur. Call your health care provider if you have any problems or questions after your procedure. °What can I expect after the procedure? °After the procedure, it is common to have: °· Vomiting. °· A sore throat. °· Mental slowness. ° °It is common to feel: °· Nauseous. °· Cold or shivery. °· Sleepy. °· Tired. °· Sore or achy, even in parts of your body where you did not have surgery. ° °Follow these instructions at home: °For at least 24 hours after the procedure: °· Do not: °? Participate in activities where you could fall or become injured. °? Drive. °? Use heavy machinery. °? Drink alcohol. °? Take sleeping pills or medicines that cause drowsiness. °? Make important decisions or sign legal documents. °? Take care of children on your own. °· Rest. °Eating and drinking °· If you vomit, drink water, juice, or soup when you can drink without vomiting. °· Drink enough fluid to keep your urine clear or pale yellow. °· Make sure you have little or no nausea before eating solid foods. °· Follow the diet recommended by your health care provider. °General instructions °· Have a responsible adult stay with you until you are awake and alert. °· Return to your normal activities as told by your health care provider. Ask your health care provider what activities are safe for you. °· Take over-the-counter and prescription medicines only as told by your health care provider. °· If you smoke, do not smoke without supervision. °· Keep all follow-up visits as told by your health care provider. This is important. °Contact a health care provider if: °· You continue to have nausea or vomiting at home, and medicines are not helpful. °· You  cannot drink fluids or start eating again. °· You cannot urinate after 8-12 hours. °· You develop a skin rash. °· You have fever. °· You have increasing redness at the site of your procedure. °Get help right away if: °· You have difficulty breathing. °· You have chest pain. °· You have unexpected bleeding. °· You feel that you are having a life-threatening or urgent problem. °This information is not intended to replace advice given to you by your health care provider. Make sure you discuss any questions you have with your health care provider. °Document Released: 04/13/2000 Document Revised: 06/10/2015 Document Reviewed: 12/20/2014 °Elsevier Interactive Patient Education © 2018 Else[employer] Inc. ° °

## 2016-07-06 ENCOUNTER — Ambulatory Visit
Admission: RE | Admit: 2016-07-06 | Discharge: 2016-07-06 | Disposition: A | Discharge status: Home / Self Care | Payer: Medicare Other | Source: Ambulatory Visit | Attending: Gastroenterology | Admitting: Gastroenterology

## 2016-07-06 ENCOUNTER — Ambulatory Visit: Payer: Medicare Other | Admitting: Anesthesiology

## 2016-07-06 ENCOUNTER — Encounter
Admission: RE | Disposition: A | Discharge status: Home / Self Care | Payer: Self-pay | Source: Ambulatory Visit | Attending: Gastroenterology

## 2016-07-06 DIAGNOSIS — Z7982 Long term (current) use of aspirin: Secondary | ICD-10-CM | POA: Insufficient documentation

## 2016-07-06 DIAGNOSIS — Z86010 Personal history of colonic polyps: Secondary | ICD-10-CM | POA: Insufficient documentation

## 2016-07-06 DIAGNOSIS — Z79899 Other long term (current) drug therapy: Secondary | ICD-10-CM | POA: Diagnosis not present

## 2016-07-06 DIAGNOSIS — Z87891 Personal history of nicotine dependence: Secondary | ICD-10-CM | POA: Insufficient documentation

## 2016-07-06 DIAGNOSIS — Z1211 Encounter for screening for malignant neoplasm of colon: Secondary | ICD-10-CM | POA: Diagnosis not present

## 2016-07-06 DIAGNOSIS — K635 Polyp of colon: Secondary | ICD-10-CM | POA: Diagnosis not present

## 2016-07-06 DIAGNOSIS — G4730 Sleep apnea, unspecified: Secondary | ICD-10-CM | POA: Insufficient documentation

## 2016-07-06 DIAGNOSIS — K5730 Diverticulosis of large intestine without perforation or abscess without bleeding: Secondary | ICD-10-CM | POA: Diagnosis not present

## 2016-07-06 DIAGNOSIS — K640 First degree hemorrhoids: Secondary | ICD-10-CM | POA: Diagnosis not present

## 2016-07-06 DIAGNOSIS — D123 Benign neoplasm of transverse colon: Secondary | ICD-10-CM | POA: Diagnosis not present

## 2016-07-06 HISTORY — PX: COLONOSCOPY WITH PROPOFOL: SHX5780

## 2016-07-06 HISTORY — DX: Sleep apnea, unspecified: G47.30

## 2016-07-06 HISTORY — DX: Tinnitus, unspecified ear: H93.19

## 2016-07-06 HISTORY — DX: Unspecified asthma, uncomplicated: J45.909

## 2016-07-06 HISTORY — PX: POLYPECTOMY: SHX5525

## 2016-07-06 SURGERY — COLONOSCOPY WITH PROPOFOL
Anesthesia: General | Wound class: Contaminated

## 2016-07-06 MED ORDER — STERILE WATER FOR IRRIGATION IR SOLN
Status: DC | PRN
  Administered 2016-07-06: 09:00:00

## 2016-07-06 MED ORDER — OXYCODONE HCL 5 MG/5ML PO SOLN: 5.0000 mg | Freq: Once | ORAL | Status: DC | PRN

## 2016-07-06 MED ORDER — OXYCODONE HCL 5 MG PO TABS: 5.0000 mg | ORAL_TABLET | Freq: Once | ORAL | Status: DC | PRN

## 2016-07-06 MED ORDER — LIDOCAINE HCL (CARDIAC) 20 MG/ML IV SOLN
INTRAVENOUS | Status: DC | PRN
  Administered 2016-07-06: 50 mg via INTRAVENOUS

## 2016-07-06 MED ORDER — PROPOFOL 10 MG/ML IV BOLUS
INTRAVENOUS | Status: DC | PRN
  Administered 2016-07-06: 100 mg via INTRAVENOUS
  Administered 2016-07-06 (×3): 50 mg via INTRAVENOUS

## 2016-07-06 MED ORDER — LACTATED RINGERS IV SOLN
INTRAVENOUS | Status: DC
Start: 2016-07-06 — End: 2016-07-06

## 2016-07-06 MED ORDER — LACTATED RINGERS IV SOLN
INTRAVENOUS | Status: DC | PRN
  Administered 2016-07-06: 09:00:00 via INTRAVENOUS

## 2016-07-06 SURGICAL SUPPLY — 23 items

## 2016-07-06 NOTE — Anesthesia Postprocedure Evaluation (Signed)
Anesthesia Post Note  Patient: Earl Crawford  Procedure(s) Performed: Procedure(s) (LRB): COLONOSCOPY WITH PROPOFOL (N/A) POLYPECTOMY  Patient location during evaluation: PACU Anesthesia Type: General Level of consciousness: awake and alert Pain management: pain level controlled Vital Signs Assessment: post-procedure vital signs reviewed and stable Respiratory status: spontaneous breathing Cardiovascular status: blood pressure returned to baseline Postop Assessment: no headache Anesthetic complications: no    Jaci Standard, III,  Traniya Prichett D

## 2016-07-06 NOTE — H&P (Signed)
Lucilla Lame, MD [address] [place], [place] [address] [phone] Fax : [phone]  Primary Care Physician:  Birdie Sons, MD Primary Gastroenterologist:  Dr. Allen Norris  Pre-Procedure History & Physical: HPI:  Earl Crawford is a 66 y.o. male is here for an colonoscopy.   Past Medical History:  Diagnosis Date  . Asthma    as child  . History of measles   . History of mumps   . Sleep apnea    not currently using CPAP "too noisy"  . Tinnitus     Past Surgical History:  Procedure Laterality Date  . COLONOSCOPY    . HERNIA REPAIR  1970   herniorrhapy    Prior to Admission medications   Medication Sig Start Date End Date Taking? Authorizing Provider  aspirin 81 MG tablet Take 1 tablet by mouth daily. 05/04/08  Yes [provider]  atorvastatin (LIPITOR) 20 MG tablet Take 1 tablet (20 mg total) by mouth daily. 04/03/16  Yes Birdie Sons, MD  Cholecalciferol (VITAMIN D3) 5000 units CAPS Take 1 capsule by mouth daily.   Yes [provider]  Hypromellose Vertell Limber ALLERGY NASAL SPRAY NA) Place into the nose.   Yes [provider]  Multiple Vitamins-Minerals (MULTIVITAMIN ADULT PO) Take 1 tablet by mouth daily. 02/02/06  Yes [provider]  Omega-3 Fatty Acids (FISH OIL) 1200 MG CAPS Take 1 capsule by mouth daily.   Yes [provider]  PARoxetine (PAXIL) 40 MG tablet TAKE ONE TABLET BY MOUTH ONCE DAILY 05/13/16  Yes Birdie Sons, MD  traZODone (DESYREL) 150 MG tablet TAKE ONE-HALF TO ONE TABLET BY MOUTH AT BEDTIME 05/13/16  Yes Birdie Sons, MD    Allergies as of 05/25/2016  . (No Known Allergies)    Family History  Problem Relation Age of Onset  . Macular degeneration Mother   . Heart attack Father   . Hypertension Father   . Obesity Father   . Diabetes Other   . Cancer Neg Hx     Social History   Social History  . Marital status: Married    Spouse name: N/A  . Number of children: 4   . Years of education: N/A   Occupational History  . 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 Smoking status: Former Research scientist (life sciences)  . Smokeless tobacco: Never Used     Comment: 1 year in college  . Alcohol use 0.0 oz/week     Comment: occasional use, 1 drink/mo  . Drug use: No  . Sexual activity: Not on file   Other Topics Concern  . Not on file   Social History Narrative  . No narrative on file    Review of Systems: See HPI, otherwise negative ROS  Physical Exam: BP 114/72   Pulse 83   Temp 98.2 F (36.8 C) (Temporal)   Resp 16   Ht 5\' 10"  (1.778 m)   Wt 228 lb (103.4 kg)   SpO2 96%   BMI 32.71 kg/m  General:   Alert,  pleasant and cooperative in NAD Head:  Normocephalic and atraumatic. Neck:  Supple; no masses or thyromegaly. Lungs:  Clear throughout to auscultation.    Heart:  Regular rate and rhythm. Abdomen:  Soft, nontender and nondistended. Normal bowel sounds, without guarding, and without rebound.   Neurologic:  Alert and  oriented x4;  grossly normal neurologically.  Impression/Plan: Earl Hoyer  Crawford is here for an colonoscopy to be performed for history of colon poylps  Risks, benefits, limitations, and alternatives regarding  colonoscopy have been reviewed with the patient.  Questions have been answered.  All parties agreeable.   Lucilla Lame, MD  07/06/2016, 8:38 AM

## 2016-07-06 NOTE — Transfer of Care (Signed)
Immediate Anesthesia Transfer of Care Note  Patient: Earl Crawford  Procedure(s) Performed: Procedure(s) with comments: COLONOSCOPY WITH PROPOFOL (N/A) - sleep apnea POLYPECTOMY  Patient Location: PACU  Anesthesia Type: General  Level of Consciousness: awake, alert  and patient cooperative  Airway and Oxygen Therapy: Patient Spontanous Breathing and Patient connected to supplemental oxygen  Post-op Assessment: Post-op Vital signs reviewed, Patient's Cardiovascular Status Stable, Respiratory Function Stable, Patent Airway and No signs of Nausea or vomiting  Post-op Vital Signs: Reviewed and stable  Complications: No apparent anesthesia complications

## 2016-07-06 NOTE — Op Note (Addendum)
[facility] Patient Name: Earl Crawford Procedure Date: 07/06/2016 8:39 AM MRN: [id number] Account #: [id number] Date of Birth: 1950/03/16 Admit Type: Outpatient Age: 66 Room: [facility] OR ROOM 01 Gender: Male Note Status: Finalized Procedure:            Colonoscopy Indications:          High risk colon cancer surveillance: Personal history                        of colonic polyps Providers:            Lucilla Lame MD, MD Referring MD:         Kirstie Peri. Caryn Section, MD (Referring MD) Medicines:            Propofol per Anesthesia Complications:        No immediate complications. Procedure:            Pre-Anesthesia Assessment:                       - Prior to the procedure, a History and Physical was                        performed, and patient medications and allergies were                        reviewed. The patient's tolerance of previous                        anesthesia was also reviewed. The risks and benefits of                        the procedure and the sedation options and risks were                        discussed with the patient. All questions were                        answered, and informed consent was obtained. Prior                        Anticoagulants: The patient has taken no previous                        anticoagulant or antiplatelet agents. ASA Grade                        Assessment: II - A patient with mild systemic disease.                        After reviewing the risks and benefits, the patient was                        deemed in satisfactory condition to undergo the                        procedure.                       After obtaining informed consent, the colonoscope was  passed under direct vision. Throughout the procedure,                        the patient's blood pressure, pulse, and oxygen                        saturations were monitored continuously. The [place] [id number]) was introduced through the                        anus and advanced to the the cecum, identified by                        appendiceal orifice and ileocecal valve. The                        colonoscopy was performed without difficulty. The                        patient tolerated the procedure well. The quality of                        the bowel preparation was good. Findings:      The perianal and digital rectal examinations were normal.      Three sessile polyps were found in the transverse colon. The polyps were       3 to 6 mm in size. These polyps were removed with a cold snare.       Resection and retrieval were complete.      Many small-mouthed diverticula were found in the entire colon.      Non-bleeding internal hemorrhoids were found during retroflexion. The       hemorrhoids were moderate and Grade I (internal hemorrhoids that do not       prolapse). Impression:           - Three 3 to 6 mm polyps in the transverse colon,                        removed with a cold snare. Resected and retrieved.                       - Diverticulosis in the entire examined colon.                       - Non-bleeding internal hemorrhoids. Recommendation:       - Discharge patient to home.                       - Resume previous diet.                       - Repeat colonoscopy in 5 years for surveillance. Procedure Code(s):    --- Professional ---                       [phone], Colonoscopy, flexible; with removal of tumor(s),                        polyp(s), or other lesion(s) by snare technique Diagnosis Code(s):    --- Professional ---  Z86.010, Personal history of colonic polyps                       D12.3, Benign neoplasm of transverse colon (hepatic                        flexure or splenic flexure) CPT copyright 2016 American Medical Association. All rights reserved. The codes documented in this report are preliminary and upon coder review  may  be revised to meet current compliance requirements. Lucilla Lame MD, MD 07/06/2016 9:00:03 AM This report has been signed electronically. Number of Addenda: 0 Note Initiated On: 07/06/2016 8:39 AM Scope Withdrawal Time: 0 hours 7 minutes 51 seconds  Total Procedure Duration: 0 hours 10 minutes 25 seconds       [facility]

## 2016-07-06 NOTE — Anesthesia Procedure Notes (Signed)
Performed by: Liane Tribbey Pre-anesthesia Checklist: Patient identified, Emergency Drugs available, Suction available, Timeout performed and Patient being monitored Patient Re-evaluated:Patient Re-evaluated prior to induction Oxygen Delivery Method: Nasal cannula Placement Confirmation: positive ETCO2       

## 2016-07-06 NOTE — Anesthesia Preprocedure Evaluation (Signed)
Anesthesia Evaluation  Patient identified by MRN, date of birth, ID band Patient awake    Reviewed: Allergy & Precautions, H&P , NPO status , Patient's Chart, lab work & pertinent test results  Airway Mallampati: II  TM Distance: >3 FB Neck ROM: full    Dental no notable dental hx.    Pulmonary sleep apnea , former smoker,    Pulmonary exam normal        Cardiovascular Normal cardiovascular exam     Neuro/Psych    GI/Hepatic negative GI ROS, Neg liver ROS,   Endo/Other  negative endocrine ROS  Renal/GU negative Renal ROS     Musculoskeletal   Abdominal   Peds  Hematology negative hematology ROS (+)   Anesthesia Other Findings   Reproductive/Obstetrics                             Anesthesia Physical Anesthesia Plan  ASA: II  Anesthesia Plan: General   Post-op Pain Management:    Induction:   PONV Risk Score and Plan:   Airway Management Planned:   Additional Equipment:   Intra-op Plan:   Post-operative Plan:   Informed Consent:   Plan Discussed with:   Anesthesia Plan Comments:         Anesthesia Quick Evaluation

## 2016-07-07 ENCOUNTER — Encounter: Payer: Self-pay | Admitting: Gastroenterology

## 2016-07-08 ENCOUNTER — Encounter: Payer: Self-pay | Admitting: Gastroenterology

## 2016-07-09 ENCOUNTER — Encounter: Payer: Self-pay | Admitting: Gastroenterology

## 2016-09-21 ENCOUNTER — Other Ambulatory Visit: Payer: Self-pay | Admitting: Family Medicine

## 2016-11-02 ENCOUNTER — Ambulatory Visit (INDEPENDENT_AMBULATORY_CARE_PROVIDER_SITE_OTHER): Payer: Medicare Other | Admitting: Family Medicine

## 2016-11-02 ENCOUNTER — Encounter: Payer: Self-pay | Admitting: Family Medicine

## 2016-11-02 VITALS — BP 144/88 | HR 94 | Temp 98.3°F | Resp 16 | Wt 243.0 lb

## 2016-11-02 DIAGNOSIS — H6123 Impacted cerumen, bilateral: Secondary | ICD-10-CM

## 2016-11-02 DIAGNOSIS — Z23 Encounter for immunization: Secondary | ICD-10-CM | POA: Diagnosis not present

## 2016-11-02 DIAGNOSIS — H6983 Other specified disorders of Eustachian tube, bilateral: Secondary | ICD-10-CM

## 2016-11-02 DIAGNOSIS — J302 Other seasonal allergic rhinitis: Secondary | ICD-10-CM | POA: Diagnosis not present

## 2016-11-02 DIAGNOSIS — H6993 Unspecified Eustachian tube disorder, bilateral: Secondary | ICD-10-CM

## 2016-11-02 HISTORY — DX: Impacted cerumen, bilateral: H61.23

## 2016-11-02 MED ORDER — CETIRIZINE HCL 10 MG PO TABS: 10.0000 mg | ORAL_TABLET | Freq: Every day | ORAL | 11 refills | Status: DC

## 2016-11-02 MED ORDER — FLUTICASONE PROPIONATE 50 MCG/ACT NA SUSP: 2.0000 | Freq: Every day | NASAL | 6 refills | Status: DC

## 2016-11-02 NOTE — Patient Instructions (Signed)
Barotitis Media Barotitis media is inflammation of the middle ear. This condition occurs when an auditory tube (eustachian tube) is blocked in one or both ears. These tubes lead from the middle ear to the back of the nose (nasopharynx). This condition typically occurs when you experience changes in pressure, such as when flying or scuba diving. Untreated barotitis media may lead to damage or hearing loss (barotrauma), which may become permanent. What are the causes? This condition may be caused by changes in air pressure from:  Flying.  Scuba diving.  A nearby explosion. What increases the risk? The following factors may make you more likely to develop this condition:  Middle ear infection.  Sinus infection.  A cold.  Environmental allergies.  Small eustachian tubes.  Recent ear surgery. What are the signs or symptoms? Symptoms of this condition may include:  Ear pain.  Hearing loss. In severe cases, symptoms can include:  Dizziness and nausea (vertigo).  Temporary facial paralysis. How is this diagnosed? This condition is diagnosed based on:  A physical exam. Your health care provider may:  Use a device (otoscope) to look into your ear canal and check your eardrum.  Do a test that changes air pressure in the middle ear to check how well the eardrum moves and to see if the eustachian tube is working(tympanogram).  Your medical history. In some cases, your health care provider may have you take a hearing test. You may also be referred to someone who specializes in ear treatment (otolaryngologist, "ENT"). How is this treated? This condition may be treated with:  Medicines to relieve congestion in your nose, sinus, or upper respiratory tract (decongestants).  Techniques to equalize pressure (to "pop" your ears), such as:  Yawning.  Chewing gum.  Swallowing. In severe cases, you may need surgery to relieve your symptoms or to prevent future inflammation. Follow  these instructions at home:  Take over-the-counter and prescription medicines only as told by your health care provider.  Do not put anything into your ears to clean or unplug them. Ear drops will not help.  Keep all follow-up visits as told by your health care provider. This is important. How is this prevented? Using these strategies may help to prevent barotitis media:  Chewing gum with frequent, forceful swallowing during takeoff and landing when flying.  Holding your nose and gently blowing to pop your ears for equalizing pressure changes. This forces air into the eustachian tube.  Yawning during air pressure changes.  Using a nasal decongestant about 30-60 minutes before flying, if you have nasal congestion. Contact a health care provider if:  You have vertigo.  You have hearing loss.  Your symptoms do not get better or they get worse.  You have a fever. Get help right away if:  You have a severe headache, ear pain, and dizziness.  You have balance problems.  You cannot move or feel part of your face.  You have bloody or pus-like drainage from your ears. Summary  Barotitis media is inflammation of the middle ear.  This condition typically occurs when you experience changes in pressure, such as when flying or scuba diving.  You may be at a higher risk for this condition if you have small eustachian tubes, had recent ear surgery, or have allergies, a cold, or sinus or middle ear infection.  This condition may be treated with medicines or techniques to equalize pressure in your ears.  Strategies can be used to help prevent barotitis media. This information is   not intended to replace advice given to you by your health care provider. Make sure you discuss any questions you have with your health care provider. Document Released: 01/03/2000 Document Revised: 11/25/2015 Document Reviewed: 11/25/2015 Elsevier Interactive Patient Education  2017 Elsevier Inc.  

## 2016-11-02 NOTE — Progress Notes (Signed)
Patient: Earl Crawford Male    DOB: 1950-08-07   66 y.o.   MRN: [id number] Visit Date: 11/02/2016  Today's Provider: Lavon Paganini, MD   Chief Complaint  Patient presents with  . Sinus Problem   Subjective:    Sinus Problem  This is a new problem. Episode onset: x 2 weeks. The problem is unchanged. There has been no fever. He is experiencing no pain. Associated symptoms include congestion, coughing (productive of clear sputum) and ear pain (ear congestion; pt states he "has a tendency to produce excess ear wax", and Dr. Caryn Section states this needs to be irrigated q 6  months. Last irrigated 6 months ago). Pertinent negatives include no chills, diaphoresis, headaches, hoarse voice, neck pain, shortness of breath, sinus pressure, sneezing, sore throat or swollen glands. Treatments tried: allergy relief x 1 week. The treatment provided no relief.  Has pressure behind his ears that he describes as feeling a need to pop them. He describes hearing loss as sounding as if he is talking into a barrel     No Known Allergies   Current Outpatient Prescriptions:  .  aspirin 81 MG tablet, Take 1 tablet by mouth daily., Disp: , Rfl:  .  atorvastatin (LIPITOR) 20 MG tablet, Take 1 tablet (20 mg total) by mouth daily., Disp: 90 tablet, Rfl: 4 .  Cholecalciferol (VITAMIN D3) 5000 units CAPS, Take 1 capsule by mouth daily., Disp: , Rfl:  .  Multiple Vitamins-Minerals (MULTIVITAMIN ADULT PO), Take 1 tablet by mouth daily., Disp: , Rfl:  .  Omega-3 Fatty Acids (FISH OIL) 1200 MG CAPS, Take 1 capsule by mouth daily., Disp: , Rfl:  .  PARoxetine (PAXIL) 40 MG tablet, TAKE ONE TABLET BY MOUTH ONCE DAILY, Disp: 90 tablet, Rfl: 3 .  traZODone (DESYREL) 150 MG tablet, TAKE ONE-HALF TO ONE TABLET BY MOUTH AT BEDTIME, Disp: 90 tablet, Rfl: 3 .  cetirizine (ZYRTEC) 10 MG tablet, Take 1 tablet (10 mg total) by mouth daily., Disp: 30 tablet, Rfl: 11 .  fluticasone (FLONASE) 50 MCG/ACT nasal spray, Place 2  sprays into both nostrils daily., Disp: 16 g, Rfl: 6  Review of Systems  Constitutional: Negative for chills and diaphoresis.  HENT: Positive for congestion and ear pain (ear congestion; pt states he "has a tendency to produce excess ear wax", and Dr. Caryn Section states this needs to be irrigated q 6  months. Last irrigated 6 months ago). Negative for hoarse voice, sinus pressure, sneezing and sore throat.   Respiratory: Positive for cough (productive of clear sputum). Negative for shortness of breath.   Musculoskeletal: Negative for neck pain.  Neurological: Negative for headaches.    Social History  Substance Use Topics  . Smoking status: Former Research scientist (life sciences)  . Smokeless tobacco: Never Used     Comment: 1 year in college  . Alcohol use 0.0 oz/week     Comment: occasional use, 1 drink/mo   Objective:   BP (!) 144/88 (BP Location: Left Arm, Patient Position: Sitting, Cuff Size: Large)   Pulse 94   Temp 98.3 F (36.8 C) (Oral)   Resp 16   Wt 243 lb (110.2 kg)   SpO2 95%   BMI 34.87 kg/m  Vitals:   11/02/16 0952  BP: (!) 144/88  Pulse: 94  Resp: 16  Temp: 98.3 F (36.8 C)  TempSrc: Oral  SpO2: 95%  Weight: 243 lb (110.2 kg)     Physical Exam  Constitutional: He is oriented to person, place,  and time. He appears well-developed and well-nourished. No distress.  HENT:  Head: Normocephalic and atraumatic.  Right Ear: External ear and ear canal normal.  Left Ear: External ear and ear canal normal.  Nose: Nose normal.  Mouth/Throat: Oropharynx is clear and moist.  Bilateral cerumen impaction  Eyes: Pupils are equal, round, and reactive to light. Conjunctivae are normal. No scleral icterus.  Neck: Neck supple. No thyromegaly present.  Cardiovascular: Normal rate, regular rhythm and normal heart sounds.   No murmur heard. Pulmonary/Chest: Effort normal and breath sounds normal. No respiratory distress. He has no wheezes. He has no rales.  Musculoskeletal: He exhibits no edema.    Lymphadenopathy:    He has no cervical adenopathy.  Neurological: He is alert and oriented to person, place, and time.  Skin: Skin is warm and dry. No rash noted.  Psychiatric: He has a normal mood and affect. His behavior is normal.  Vitals reviewed.      Assessment & Plan:      Problem List Items Addressed This Visit      Respiratory   Allergic rhinitis - Primary    Symptoms and exam consistent with allergic rhinitis with eustachian tube dysfunction Discussed environmental factors that can affect allergy symptoms Trial of Flonase and Zyrtec Return precautions discussed        Nervous and Auditory   Bilateral impacted cerumen    Ears irrigated today with improvement of symptoms Symptoms symptoms also related to eustachian tube dysfunction, for which we are treating with Flonase and Zyrtec       Other Visit Diagnoses    Eustachian tube dysfunction, bilateral       Encounter for immunization       Relevant Orders   Flu vaccine HIGH DOSE PF (Completed)      Return if symptoms worsen or fail to improve.     The entirety of the information documented in the History of Present Illness, Review of Systems and Physical Exam were personally obtained by me. Portions of this information were initially documented by Raquel Sarna Ratchford, CMA and reviewed by me for thoroughness and accuracy.     Lavon Paganini, MD  [facility]

## 2016-11-02 NOTE — Assessment & Plan Note (Signed)
Ears irrigated today with improvement of symptoms Symptoms symptoms also related to eustachian tube dysfunction, for which we are treating with Flonase and Zyrtec

## 2016-11-02 NOTE — Assessment & Plan Note (Signed)
Symptoms and exam consistent with allergic rhinitis with eustachian tube dysfunction Discussed environmental factors that can affect allergy symptoms Trial of Flonase and Zyrtec Return precautions discussed

## 2017-05-10 ENCOUNTER — Other Ambulatory Visit: Payer: Self-pay | Admitting: Family Medicine

## 2017-05-31 ENCOUNTER — Ambulatory Visit (INDEPENDENT_AMBULATORY_CARE_PROVIDER_SITE_OTHER): Payer: Medicare Other | Admitting: Family Medicine

## 2017-05-31 ENCOUNTER — Encounter: Payer: Self-pay | Admitting: Family Medicine

## 2017-05-31 VITALS — BP 126/82 | HR 71 | Temp 98.9°F | Resp 15 | Wt 241.2 lb

## 2017-05-31 DIAGNOSIS — H6123 Impacted cerumen, bilateral: Secondary | ICD-10-CM | POA: Diagnosis not present

## 2017-05-31 NOTE — Progress Notes (Addendum)
  Subjective:     Patient ID: Earl Crawford, male   DOB: December 06, 1950, 67 y.o.   MRN: [id number] Chief Complaint  Patient presents with  . Cerumen Impaction    Patient comes in office today with concerns of ear impaction in both ears for one week. Patient states that he has been using otc Debrox, Peroixide and Sudafed.    HPI Last had cerumen impaction on October of last year. Denies regular Q -tip use. No allergy sx.  Review of Systems     Objective:   Physical Exam  Constitutional: He appears well-developed and well-nourished. No distress.  HENT:  Bilateral ear canal wax impactions. After irrigation per Kat: Ear canals are patent, TM's intact and patient reports improvement in his hearing.       Assessment:    1. Bilateral impacted cerumen: resolved with irrigation    Plan:    Discussed use of weekly mineral oil drops.

## 2017-05-31 NOTE — Patient Instructions (Signed)
Discussed use of mineral oil drops weekly to keep wax soft.

## 2017-06-02 ENCOUNTER — Telehealth: Payer: Self-pay | Admitting: Family Medicine

## 2017-06-02 NOTE — Telephone Encounter (Signed)
Left patient a message to return my call for an appt. °

## 2017-06-30 ENCOUNTER — Telehealth: Payer: Self-pay | Admitting: Family Medicine

## 2017-06-30 NOTE — Telephone Encounter (Signed)
LMTCMB about scheduling his wellness visits for this year- AWV w [facility] and physical f/u.  Last appts were 04/2016 so he is ready to schedule.

## 2017-07-01 ENCOUNTER — Encounter: Payer: Self-pay | Admitting: Family Medicine

## 2017-07-01 ENCOUNTER — Ambulatory Visit (INDEPENDENT_AMBULATORY_CARE_PROVIDER_SITE_OTHER): Payer: Medicare Other | Admitting: Family Medicine

## 2017-07-01 VITALS — BP 140/80 | HR 73 | Temp 97.3°F | Resp 15 | Wt 241.6 lb

## 2017-07-01 DIAGNOSIS — W57XXXA Bitten or stung by nonvenomous insect and other nonvenomous arthropods, initial encounter: Secondary | ICD-10-CM | POA: Diagnosis not present

## 2017-07-01 DIAGNOSIS — S30861A Insect bite (nonvenomous) of abdominal wall, initial encounter: Secondary | ICD-10-CM

## 2017-07-01 NOTE — Patient Instructions (Signed)
Cleanse area of bite with soap and water and apply triple antibiotic ointment with band-aid. This will help prevent skin infection and keep the skin soft to extrude any further residual tick parts. Monitor for flu symptoms or expanding area of rash over the next 2-4 weeks.

## 2017-07-01 NOTE — Progress Notes (Signed)
  Subjective:     Patient ID: Earl Crawford, male   DOB: 1950/08/06, 67 y.o.   MRN: [id number] Chief Complaint  Patient presents with  . Tick Removal    Patient comes in office today with concerns of a tick that he removed last night from his left hip. Patient states that over the past two days he has been working outside and believes that tick was attatched for at least a day. Patient states that he pulled tick off with tweezers and his wife tried pulling rest of tick off of patient but believes that the head is still attatched to skin. Patient reports redness at site of bite, he has applied rubbing alcohol to skin.    HPI No flu like sx reported.  Review of Systems     Objective:   Physical Exam  Constitutional: He appears well-developed and well-nourished. No distress.  Skin:  Left lateral abdomen/flank with insect body with residual foreign body and small inflammatory flare. Procedure note: Area cleansed with alcohol and foreign body removal (residual tick) attempted with splinter forceps and 25 gauge needle. Partial removal after numerous attempts. Abx ointment and band-aid applied.       Assessment:    1. Insect bite of abdomen, initial encounter: with attempted removal of f.b.    Plan:    Discussed daily use of abx ointment and monitor for tick fever.

## 2017-07-05 NOTE — Telephone Encounter (Signed)
Pt called back stating that he would rather have both appts back to back on the sameday and that he needed them to be on a Monday morning. Pt is now scheduled for AWV and CPE on 10/25/17 @ 9:20 and 10 am. Thanks TNP

## 2017-07-11 NOTE — Telephone Encounter (Signed)
Thanks

## 2017-07-19 ENCOUNTER — Ambulatory Visit: Payer: Medicare Other

## 2017-07-26 ENCOUNTER — Encounter: Payer: Medicare Other | Admitting: Family Medicine

## 2017-09-02 ENCOUNTER — Telehealth: Payer: Self-pay | Admitting: Family Medicine

## 2017-09-02 NOTE — Telephone Encounter (Signed)
It should be safe to take, but cannot say whether or not it is effective.

## 2017-09-02 NOTE — Telephone Encounter (Signed)
Pt called wanting to know if it would be ok for him to take a supplement Prevagin for short term memory loss.  You can leave him a voice mail   Pt's BC@  [phone]  Thanks Con Memos

## 2017-09-02 NOTE — Telephone Encounter (Signed)
Please advise 

## 2017-09-02 NOTE — Telephone Encounter (Signed)
Left detailed message on pt's vm

## 2017-10-25 ENCOUNTER — Ambulatory Visit (INDEPENDENT_AMBULATORY_CARE_PROVIDER_SITE_OTHER): Payer: Medicare Other

## 2017-10-25 ENCOUNTER — Encounter: Payer: Self-pay | Admitting: Family Medicine

## 2017-10-25 ENCOUNTER — Ambulatory Visit (INDEPENDENT_AMBULATORY_CARE_PROVIDER_SITE_OTHER): Payer: Medicare Other | Admitting: Family Medicine

## 2017-10-25 VITALS — BP 124/80 | HR 68 | Temp 99.5°F | Ht 70.0 in | Wt 238.2 lb

## 2017-10-25 DIAGNOSIS — Z0000 Encounter for general adult medical examination without abnormal findings: Secondary | ICD-10-CM

## 2017-10-25 DIAGNOSIS — Z23 Encounter for immunization: Secondary | ICD-10-CM | POA: Diagnosis not present

## 2017-10-25 DIAGNOSIS — G4733 Obstructive sleep apnea (adult) (pediatric): Secondary | ICD-10-CM | POA: Diagnosis not present

## 2017-10-25 DIAGNOSIS — J302 Other seasonal allergic rhinitis: Secondary | ICD-10-CM

## 2017-10-25 DIAGNOSIS — G4700 Insomnia, unspecified: Secondary | ICD-10-CM | POA: Diagnosis not present

## 2017-10-25 DIAGNOSIS — E781 Pure hyperglyceridemia: Secondary | ICD-10-CM | POA: Diagnosis not present

## 2017-10-25 DIAGNOSIS — Z125 Encounter for screening for malignant neoplasm of prostate: Secondary | ICD-10-CM | POA: Diagnosis not present

## 2017-10-25 DIAGNOSIS — Z9989 Dependence on other enabling machines and devices: Secondary | ICD-10-CM | POA: Diagnosis not present

## 2017-10-25 DIAGNOSIS — F419 Anxiety disorder, unspecified: Secondary | ICD-10-CM

## 2017-10-25 DIAGNOSIS — H6123 Impacted cerumen, bilateral: Secondary | ICD-10-CM | POA: Diagnosis not present

## 2017-10-25 NOTE — Patient Instructions (Signed)
   The CDC recommends two doses of Shingrix (the shingles vaccine) separated by 2 to 6 months for adults age 67 years and older. I recommend checking with your insurance plan regarding coverage for this vaccine.   

## 2017-10-25 NOTE — Patient Instructions (Addendum)
[name] , Thank you for taking time to come for your Medicare Wellness Visit. I appreciate your ongoing commitment to your health goals. Please review the following plan we discussed and let me know if I can assist you in the future.   Screening recommendations/referrals: Colonoscopy: Up to date Recommended yearly ophthalmology/optometry visit for glaucoma screening and checkup Recommended yearly dental visit for hygiene and checkup  Vaccinations: Influenza vaccine: Up to date Pneumococcal vaccine: Up to date Tdap vaccine: Up to date Shingles vaccine: Pt declines today.     Advanced directives: Please bring a copy of your POA (Power of Attorney) and/or Living Will to your next appointment.   Conditions/risks identified: Obesity- recommend to decrease ice cream intake to a couple times weekly and to get low or no sugar types.   Next appointment: 10:00 AM today with Dr Earl Crawford.   Preventive Care 32 Years and Older, Male Preventive care refers to lifestyle choices and visits with your health care provider that can promote health and wellness. What does preventive care include?  A yearly physical exam. This is also called an annual well check.  Dental exams once or twice a year.  Routine eye exams. Ask your health care provider how often you should have your eyes checked.  Personal lifestyle choices, including:  Daily care of your teeth and gums.  Regular physical activity.  Eating a healthy diet.  Avoiding tobacco and drug use.  Limiting alcohol use.  Practicing safe sex.  Taking low doses of aspirin every day.  Taking vitamin and mineral supplements as recommended by your health care provider. What happens during an annual well check? The services and screenings done by your health care provider during your annual well check will depend on your age, overall health, lifestyle risk factors, and family history of disease. Counseling  Your health care provider may ask  you questions about your:  Alcohol use.  Tobacco use.  Drug use.  Emotional well-being.  Home and relationship well-being.  Sexual activity.  Eating habits.  History of falls.  Memory and ability to understand (cognition).  Work and work Statistician. Screening  You may have the following tests or measurements:  Height, weight, and BMI.  Blood pressure.  Lipid and cholesterol levels. These may be checked every 5 years, or more frequently if you are over 18 years old.  Skin check.  Lung cancer screening. You may have this screening every year starting at age 61 if you have a 30-pack-year history of smoking and currently smoke or have quit within the past 15 years.  Fecal occult blood test (FOBT) of the stool. You may have this test every year starting at age 65.  Flexible sigmoidoscopy or colonoscopy. You may have a sigmoidoscopy every 5 years or a colonoscopy every 10 years starting at age 34.  Prostate cancer screening. Recommendations will vary depending on your family history and other risks.  Hepatitis C blood test.  Hepatitis B blood test.  Sexually transmitted disease (STD) testing.  Diabetes screening. This is done by checking your blood sugar (glucose) after you have not eaten for a while (fasting). You may have this done every 1-3 years.  Abdominal aortic aneurysm (AAA) screening. You may need this if you are a current or former smoker.  Osteoporosis. You may be screened starting at age 53 if you are at high risk. Talk with your health care provider about your test results, treatment options, and if necessary, the need for more tests. Vaccines  Your health care provider may recommend certain vaccines, such as:  Influenza vaccine. This is recommended every year.  Tetanus, diphtheria, and acellular pertussis (Tdap, Td) vaccine. You may need a Td booster every 10 years.  Zoster vaccine. You may need this after age 78.  Pneumococcal 13-valent conjugate  (PCV13) vaccine. One dose is recommended after age 38.  Pneumococcal polysaccharide (PPSV23) vaccine. One dose is recommended after age 75. Talk to your health care provider about which screenings and vaccines you need and how often you need them. This information is not intended to replace advice given to you by your health care provider. Make sure you discuss any questions you have with your health care provider. Document Released: 02/01/2015 Document Revised: 09/25/2015 Document Reviewed: 11/06/2014 Elsevier Interactive Patient Education  2017 [place] Prevention in the Home Falls can cause injuries. They can happen to people of all ages. There are many things you can do to make your home safe and to help prevent falls. What can I do on the outside of my home?  Regularly fix the edges of walkways and driveways and fix any cracks.  Remove anything that might make you trip as you walk through a door, such as a raised step or threshold.  Trim any bushes or trees on the path to your home.  Use bright outdoor lighting.  Clear any walking paths of anything that might make someone trip, such as rocks or tools.  Regularly check to see if handrails are loose or broken. Make sure that both sides of any steps have handrails.  Any raised decks and porches should have guardrails on the edges.  Have any leaves, snow, or ice cleared regularly.  Use sand or salt on walking paths during winter.  Clean up any spills in your garage right away. This includes oil or grease spills. What can I do in the bathroom?  Use night lights.  Install grab bars by the toilet and in the tub and shower. Do not use towel bars as grab bars.  Use non-skid mats or decals in the tub or shower.  If you need to sit down in the shower, use a plastic, non-slip stool.  Keep the floor dry. Clean up any water that spills on the floor as soon as it happens.  Remove soap buildup in the tub or shower  regularly.  Attach bath mats securely with double-sided non-slip rug tape.  Do not have throw rugs and other things on the floor that can make you trip. What can I do in the bedroom?  Use night lights.  Make sure that you have a light by your bed that is easy to reach.  Do not use any sheets or blankets that are too big for your bed. They should not hang down onto the floor.  Have a firm chair that has side arms. You can use this for support while you get dressed.  Do not have throw rugs and other things on the floor that can make you trip. What can I do in the kitchen?  Clean up any spills right away.  Avoid walking on wet floors.  Keep items that you use a lot in easy-to-reach places.  If you need to reach something above you, use a strong step stool that has a grab bar.  Keep electrical cords out of the way.  Do not use floor polish or wax that makes floors slippery. If you must use wax, use non-skid floor wax.  Do  not have throw rugs and other things on the floor that can make you trip. What can I do with my stairs?  Do not leave any items on the stairs.  Make sure that there are handrails on both sides of the stairs and use them. Fix handrails that are broken or loose. Make sure that handrails are as long as the stairways.  Check any carpeting to make sure that it is firmly attached to the stairs. Fix any carpet that is loose or worn.  Avoid having throw rugs at the top or bottom of the stairs. If you do have throw rugs, attach them to the floor with carpet tape.  Make sure that you have a light switch at the top of the stairs and the bottom of the stairs. If you do not have them, ask someone to add them for you. What else can I do to help prevent falls?  Wear shoes that:  Do not have high heels.  Have rubber bottoms.  Are comfortable and fit you well.  Are closed at the toe. Do not wear sandals.  If you use a stepladder:  Make sure that it is fully  opened. Do not climb a closed stepladder.  Make sure that both sides of the stepladder are locked into place.  Ask someone to hold it for you, if possible.  Clearly mark and make sure that you can see:  Any grab bars or handrails.  First and last steps.  Where the edge of each step is.  Use tools that help you move around (mobility aids) if they are needed. These include:  Canes.  Walkers.  Scooters.  Crutches.  Turn on the lights when you go into a dark area. Replace any light bulbs as soon as they burn out.  Set up your furniture so you have a clear path. Avoid moving your furniture around.  If any of your floors are uneven, fix them.  If there are any pets around you, be aware of where they are.  Review your medicines with your doctor. Some medicines can make you feel dizzy. This can increase your chance of falling. Ask your doctor what other things that you can do to help prevent falls. This information is not intended to replace advice given to you by your health care provider. Make sure you discuss any questions you have with your health care provider. Document Released: 11/01/2008 Document Revised: 06/13/2015 Document Reviewed: 02/09/2014 Elsevier Interactive Patient Education  2017 [employer].

## 2017-10-25 NOTE — Progress Notes (Signed)
Subjective:   Earl Crawford is a 67 y.o. male who presents for Medicare Annual/Subsequent preventive examination.  Review of Systems:  N/A  Cardiac Risk Factors include: advanced age (>43men, >54 women);dyslipidemia;male gender;obesity (BMI >30kg/m2)     Objective:    Vitals: BP 124/80 (BP Location: Right Arm)   Pulse 68   Temp 99.5 F (37.5 C) (Oral)   Ht 5\' 10"  (1.778 m)   Wt 238 lb 3.2 oz (108 kg)   BMI 34.18 kg/m   Body mass index is 34.18 kg/m.  Advanced Directives 10/25/2017 07/06/2016 05/13/2015  Does Patient Have a Medical Advance Directive? Yes Yes Yes  Type of Paramedic of [place];Living will [place];Living will [place];Living will  Does patient want to make changes to medical advance directive? - No - Patient declined No - Patient declined  Copy of [place] in Chart? No - copy requested No - copy requested -    Tobacco Social History   Tobacco Use  Smoking Status Former Smoker  Smokeless Tobacco Never Used  Tobacco Comment   1 year in college     Counseling given: Not Answered Comment: 1 year in college   Clinical Intake:  Pre-visit preparation completed: Yes  Pain : No/denies pain Pain Score: 0-No pain     Nutritional Status: BMI > 30  Obese Nutritional Risks: None Diabetes: No  How often do you need to have someone help you when you read instructions, pamphlets, or other written materials from your doctor or pharmacy?: 1 - Never  Interpreter Needed?: No  Information entered by :: mMARKOSKI, lpn  Past Medical History:  Diagnosis Date  . Asthma    as child  . History of measles   . History of mumps   . Sleep apnea    not currently using CPAP "too noisy"  . Tinnitus    Past Surgical History:  Procedure Laterality Date  . COLONOSCOPY    . COLONOSCOPY WITH PROPOFOL N/A 07/06/2016   Procedure: COLONOSCOPY WITH PROPOFOL;  Surgeon: Lucilla Lame,  MD;  Location: [place];  Service: Endoscopy;  Laterality: N/A;  sleep apnea  . HERNIA REPAIR  1970   herniorrhapy  . POLYPECTOMY  07/06/2016   Procedure: POLYPECTOMY;  Surgeon: Lucilla Lame, MD;  Location: [facility];  Service: Endoscopy;;   Family History  Problem Relation Age of Onset  . Macular degeneration Mother   . Memory loss Mother   . Heart attack Father   . Hypertension Father   . Obesity Father   . Diabetes Other   . Cancer Neg Hx    Social History   Socioeconomic History  . Marital status: Married    Spouse name: Not on file  . Number of children: 2  . Years of education: Not on file  . Highest education level: Bachelor's degree (e.g., BA, AB, BS)  Occupational History  . 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 Financial resource strain: Not hard at all  . Food insecurity:    Worry: Never true    Inability: Never true  . Transportation needs:    Medical: No    Non-medical: No  Tobacco Use  . Smoking status: Former Research scientist (life sciences)  . Smokeless tobacco: Never Used  . Tobacco comment: 1 year in college  Substance and Sexual Activity  . Alcohol use: Yes    Alcohol/week: 0.0 standard  drinks    Comment: occasional use, 1-2 drink/mo  . Drug use: No  . Sexual activity: Not on file  Lifestyle  . Physical activity:    Days per week: Not on file    Minutes per session: Not on file  . Stress: Not at all  Relationships  . Social connections:    Talks on phone: Not on file    Gets together: Not on file    Attends religious service: Not on file    Active member of club or organization: Not on file    Attends meetings of clubs or organizations: Not on file    Relationship status: Not on file  Other Topics Concern  . Not on file  Social History Narrative  . Not on file    Outpatient Encounter Medications as of 10/25/2017  Medication Sig  . aspirin 81 MG tablet Take 1 tablet by mouth daily.    Marland Kitchen atorvastatin (LIPITOR) 20 MG tablet Take 1 tablet (20 mg total) by mouth daily.  . cetirizine (ZYRTEC) 10 MG tablet Take 1 tablet (10 mg total) by mouth daily.  . Cholecalciferol (VITAMIN D3) 5000 units CAPS Take 1 capsule by mouth daily.  . Multiple Vitamins-Minerals (MULTIVITAMIN ADULT PO) Take 1 tablet by mouth daily.  . Omega-3 Fatty Acids (FISH OIL) 1200 MG CAPS Take 1 capsule by mouth daily.  Marland Kitchen PARoxetine (PAXIL) 40 MG tablet TAKE 1 TABLET BY MOUTH ONCE DAILY  . traZODone (DESYREL) 150 MG tablet TAKE 1/2 TO 1 (ONE-HALF TO ONE) TABLET BY MOUTH AT BEDTIME  . fluticasone (FLONASE) 50 MCG/ACT nasal spray Place 2 sprays into both nostrils daily. (Patient not taking: Reported on 07/01/2017)   No facility-administered encounter medications on file as of 10/25/2017.     Activities of Daily Living In your present state of health, do you have any difficulty performing the following activities: 10/25/2017  Hearing? Y  Comment Has tinnitus, declines hearing aid.   Vision? N  Difficulty concentrating or making decisions? N  Walking or climbing stairs? N  Dressing or bathing? N  Doing errands, shopping? N  Preparing Food and eating ? N  Using the Toilet? N  In the past six months, have you accidently leaked urine? N  Do you have problems with loss of bowel control? N  Managing your Medications? N  Managing your Finances? N  Housekeeping or managing your Housekeeping? N  Some recent data might be hidden    Patient Care Team: Birdie Sons, MD as PCP - General (Family Medicine) Lucilla Lame, MD as Consulting Physician (Gastroenterology) [facility], [facility]   Assessment:   This is a routine wellness examination for [facility].  Exercise Activities and Dietary recommendations Current Exercise Habits: The patient does not participate in regular exercise at present(Splits wood almost daily for 1-2 hours.), Exercise limited by: None identified  Goals    . DIET - REDUCE SUGAR INTAKE      Recommend to decrease ice cream intake to a couple times weekly and to get low or no sugar types.        Fall Risk Fall Risk  10/25/2017 05/18/2016 05/13/2015  Falls in the past year? No No No   FALL RISK PREVENTION PERTAINING TO THE HOME:  Any stairs in or around the home WITH handrails? Yes  Home free of loose throw rugs in walkways, pet beds, electrical cords, etc? Yes  Adequate lighting in your home to reduce risk of falls? Yes   ASSISTIVE DEVICES  UTILIZED TO PREVENT FALLS:  Life alert? No  Use of a cane, walker or w/c? No  Grab bars in the bathroom? No  Shower chair or bench in shower? No  Elevated toilet seat or a handicapped toilet? No   DME ORDERS:  DME order needed?  Yes   TIMED UP AND GO:  Was the test performed? No .   Depression Screen PHQ 2/9 Scores 10/25/2017 05/18/2016 05/13/2015  PHQ - 2 Score 0 0 0  PHQ- 9 Score - 0 1    Cognitive Function     6CIT Screen 10/25/2017  What Year? 0 points  What month? 0 points  What time? 0 points  Count back from 20 0 points  Months in reverse 0 points  Repeat phrase 0 points  Total Score 0    Immunization History  Administered Date(s) Administered  . H1N1 11/24/2007  . Influenza Split 12/04/2006, 02/08/2009, 12/16/2009, 11/19/2010  . Influenza, High Dose Seasonal PF 11/02/2016, 10/25/2017  . Influenza-Unspecified 11/17/2014  . Pneumococcal Conjugate-13 02/19/2015  . Pneumococcal Polysaccharide-23 05/18/2016  . Td 01/23/2000  . Tdap 05/04/2008  . Zoster 02/27/2010    Qualifies for Shingles Vaccine? Yes  Zostavax completed 02/27/10. Due for Shingrix. Education has been provided regarding the importance of this vaccine. Pt has been advised to call insurance company to determine out of pocket expense. Advised may also receive vaccine at local pharmacy or Health Dept. Verbalized acceptance and understanding.  Tdap: Up to date  Flu Vaccine: Due for Flu vaccine. Does the patient want to receive this vaccine  today?  Yes .   Pneumococcal Vaccine: Up to date  Screening Tests Health Maintenance  Topic Date Due  . TETANUS/TDAP  05/05/2018  . COLONOSCOPY  07/06/2021  . INFLUENZA VACCINE  Completed  . Hepatitis C Screening  Completed  . PNA vac Low Risk Adult  Completed   Cancer Screenings:  Colorectal Screening: Completed 07/06/16.   Lung Cancer Screening: (Low Dose CT Chest recommended if Age 78-80 years, 30 pack-year currently smoking OR have quit w/in 15years.) does not qualify.   Additional Screening:  Hepatitis C Screening: Up to date  Vision Screening: Recommended annual ophthalmology exams for early detection of glaucoma and other disorders of the eye.  Dental Screening: Recommended annual dental exams for proper oral hygiene  Community Resource Referral:  CRR required this visit?  No  Plan:  I have personally reviewed and addressed the Medicare Annual Wellness questionnaire and have noted the following in the patient's chart:  A. Medical and social history B. Use of alcohol, tobacco or illicit drugs  C. Current medications and supplements D. Functional ability and status E.  Nutritional status F.  Physical activity G. Advance directives H. List of other physicians I.  Hospitalizations, surgeries, and ER visits in previous 12 months J.  [place] such as hearing and vision if needed, cognitive and depression L. Referrals and appointments - none  In addition, I have reviewed and discussed with patient certain preventive protocols, quality metrics, and best practice recommendations. A written personalized care plan for preventive services as well as general preventive health recommendations were provided to patient.  See attached scanned questionnaire for additional information.   Signed,  Fabio Neighbors, LPN Nurse Health Advisor   Nurse Recommendations: None.

## 2017-10-25 NOTE — Progress Notes (Signed)
Patient: Earl Crawford Male    DOB: 16-Feb-1950   67 y.o.   MRN: [id number] Visit Date: 10/25/2017  Today's Provider: Lelon Huh, MD   Chief Complaint  Patient presents with  . Insomnia  . Sleep Apnea   Subjective:    Insomnia  The problem has been resolved (Since being on Trazodone) since onset. Past treatments include medication. The treatment provided significant relief.  Hyperlipidemia  This is a chronic problem. The problem is controlled. Pertinent negatives include no chest pain, focal sensory loss, focal weakness, leg pain, myalgias or shortness of breath. Current antihyperlipidemic treatment includes statins. There are no compliance problems.   Sleep Apnea He reports he uses his CPAP machine every night and feels significantly better since using it. States his wife reports he sleep much more soundly and does not snore loudly when using CPAP.   Wt Readings from Last 3 Encounters:  10/25/17 238 lb 3.2 oz (108 kg)  10/25/17 238 lb 3.2 oz (108 kg)  07/01/17 241 lb 9.6 oz (109.6 kg)      No Known Allergies   Current Outpatient Medications:  .  aspirin 81 MG tablet, Take 1 tablet by mouth daily., Disp: , Rfl:  .  atorvastatin (LIPITOR) 20 MG tablet, Take 1 tablet (20 mg total) by mouth daily., Disp: 90 tablet, Rfl: 4 .  cetirizine (ZYRTEC) 10 MG tablet, Take 1 tablet (10 mg total) by mouth daily., Disp: 30 tablet, Rfl: 11 .  Cholecalciferol (VITAMIN D3) 5000 units CAPS, Take 1 capsule by mouth daily., Disp: , Rfl:  .  fluticasone (FLONASE) 50 MCG/ACT nasal spray, Place 2 sprays into both nostrils daily. (Patient not taking: Reported on 07/01/2017), Disp: 16 g, Rfl: 6 .  Multiple Vitamins-Minerals (MULTIVITAMIN ADULT PO), Take 1 tablet by mouth daily., Disp: , Rfl:  .  Omega-3 Fatty Acids (FISH OIL) 1200 MG CAPS, Take 1 capsule by mouth daily., Disp: , Rfl:  .  PARoxetine (PAXIL) 40 MG tablet, TAKE 1 TABLET BY MOUTH ONCE DAILY, Disp: 90 tablet, Rfl: 1 .   traZODone (DESYREL) 150 MG tablet, TAKE 1/2 TO 1 (ONE-HALF TO ONE) TABLET BY MOUTH AT BEDTIME, Disp: 90 tablet, Rfl: 1  Review of Systems  Constitutional: Negative.   HENT: Positive for hearing loss and tinnitus. Negative for congestion, dental problem, drooling, ear discharge, ear pain, facial swelling, mouth sores, nosebleeds, postnasal drip, rhinorrhea, sinus pressure, sinus pain, sneezing, sore throat, trouble swallowing and voice change.   Eyes: Negative.   Respiratory: Negative.  Negative for shortness of breath.   Cardiovascular: Negative.  Negative for chest pain.  Gastrointestinal: Negative.   Endocrine: Negative.   Genitourinary: Negative.   Musculoskeletal: Negative.  Negative for myalgias.  Skin: Negative.   Allergic/Immunologic: Negative.   Neurological: Negative.  Negative for focal weakness.  Hematological: Negative.   Psychiatric/Behavioral: The patient has insomnia.     Social History   Tobacco Use  . Smoking status: Former Research scientist (life sciences)  . Smokeless tobacco: Never Used  . Tobacco comment: 1 year in college  Substance Use Topics  . Alcohol use: Yes    Alcohol/week: 0.0 standard drinks    Comment: occasional use, 1-2 drink/mo   Objective:   BP 124/80   Pulse 68   Temp 99.5 F (37.5 C) (Oral)   Ht 5\' 10"  (1.778 m)   Wt 238 lb 3.2 oz (108 kg)   BMI 34.18 kg/m   Physical Exam   General Appearance:  Alert, cooperative, no distress, obese  HENT:   Both ear canals obstructed with cerumen.   Eyes:    PERRL, conjunctiva/corneas clear, EOM's intact       Lungs:     Clear to auscultation bilaterally, respirations unlabored  Heart:    Regular rate and rhythm  Neurologic:   Awake, alert, oriented x 3. No apparent focal neurological           defect.           Assessment & Plan:     1. OSA on CPAP Compliant with daily use of CPAP, and medically benefiting from device.   2. Pure hyperglyceridemia He is tolerating atorvastatin well with no adverse effects.   -  Comprehensive metabolic panel - Lipid panel  3. Insomnia, unspecified type Trazodone remains effective and well tolerating. Continue current dose.   4. Prostate cancer screening  - PSA  5. Seasonal allergic rhinitis, unspecified trigger Well controlled on Cetirizine and fluticasone. Advised to stop medication periodically   6. Bilateral impacted cerumen After soaking with Debrox, ear canal was irrigated with water until clear. Patient tolerated procedure well.    7. Anxiety Doing well with paroxetine. Is now retired and will consider weaning if doing well in retirement.        Lelon Huh, MD  [facility]

## 2017-10-26 LAB — COMPREHENSIVE METABOLIC PANEL
ALBUMIN: 4.9 g/dL — AB (ref 3.6–4.8)
ALT: 27 IU/L (ref 0–44)
AST: 21 IU/L (ref 0–40)
Albumin/Globulin Ratio: 2.5 — ABNORMAL HIGH (ref 1.2–2.2)
Alkaline Phosphatase: 49 IU/L (ref 39–117)
BUN / CREAT RATIO: 12 (ref 10–24)
BUN: 16 mg/dL (ref 8–27)
Bilirubin Total: 0.9 mg/dL (ref 0.0–1.2)
CHLORIDE: 103 mmol/L (ref 96–106)
CO2: 23 mmol/L (ref 20–29)
Calcium: 9.8 mg/dL (ref 8.6–10.2)
Creatinine, Ser: 1.32 mg/dL — ABNORMAL HIGH (ref 0.76–1.27)
GFR calc non Af Amer: 55 mL/min/{1.73_m2} — ABNORMAL LOW (ref >59)
GFR, EST AFRICAN AMERICAN: 64 mL/min/{1.73_m2} (ref >59)
GLOBULIN, TOTAL: 2 g/dL (ref 1.5–4.5)
GLUCOSE: 91 mg/dL (ref 65–99)
Potassium: 4.3 mmol/L (ref 3.5–5.2)
Sodium: 141 mmol/L (ref 134–144)
TOTAL PROTEIN: 6.9 g/dL (ref 6.0–8.5)

## 2017-10-26 LAB — LIPID PANEL
Chol/HDL Ratio: 4.1 ratio (ref 0.0–5.0)
Cholesterol, Total: 136 mg/dL (ref 100–199)
HDL: 33 mg/dL — ABNORMAL LOW (ref >39)
LDL CALC: 51 mg/dL (ref 0–99)
Triglycerides: 259 mg/dL — ABNORMAL HIGH (ref 0–149)
VLDL CHOLESTEROL CAL: 52 mg/dL — AB (ref 5–40)

## 2017-10-26 LAB — PSA: PROSTATE SPECIFIC AG, SERUM: 0.4 ng/mL (ref 0.0–4.0)

## 2017-11-05 ENCOUNTER — Other Ambulatory Visit: Payer: Self-pay | Admitting: Family Medicine

## 2017-12-05 DIAGNOSIS — M20021 Boutonniere deformity of right finger(s): Secondary | ICD-10-CM | POA: Diagnosis not present

## 2017-12-05 DIAGNOSIS — W109XXA Fall (on) (from) unspecified stairs and steps, initial encounter: Secondary | ICD-10-CM | POA: Diagnosis not present

## 2017-12-05 DIAGNOSIS — S6991XA Unspecified injury of right wrist, hand and finger(s), initial encounter: Secondary | ICD-10-CM | POA: Diagnosis not present

## 2017-12-06 DIAGNOSIS — R2689 Other abnormalities of gait and mobility: Secondary | ICD-10-CM | POA: Diagnosis not present

## 2017-12-06 DIAGNOSIS — R29898 Other symptoms and signs involving the musculoskeletal system: Secondary | ICD-10-CM | POA: Diagnosis not present

## 2017-12-06 DIAGNOSIS — M20021 Boutonniere deformity of right finger(s): Secondary | ICD-10-CM | POA: Diagnosis not present

## 2017-12-06 DIAGNOSIS — S6981XA Other specified injuries of right wrist, hand and finger(s), initial encounter: Secondary | ICD-10-CM | POA: Diagnosis not present

## 2017-12-06 DIAGNOSIS — M79644 Pain in right finger(s): Secondary | ICD-10-CM | POA: Diagnosis not present

## 2017-12-08 ENCOUNTER — Other Ambulatory Visit: Payer: Self-pay

## 2017-12-14 DIAGNOSIS — M20021 Boutonniere deformity of right finger(s): Secondary | ICD-10-CM | POA: Diagnosis not present

## 2017-12-14 DIAGNOSIS — M20029 Boutonniere deformity of unspecified finger(s): Secondary | ICD-10-CM | POA: Insufficient documentation

## 2017-12-15 ENCOUNTER — Other Ambulatory Visit: Payer: Self-pay | Admitting: Family Medicine

## 2018-01-04 DIAGNOSIS — M20021 Boutonniere deformity of right finger(s): Secondary | ICD-10-CM | POA: Diagnosis not present

## 2018-01-17 DIAGNOSIS — D1800 Hemangioma unspecified site: Secondary | ICD-10-CM | POA: Diagnosis not present

## 2018-01-17 DIAGNOSIS — Z1283 Encounter for screening for malignant neoplasm of skin: Secondary | ICD-10-CM | POA: Diagnosis not present

## 2018-01-17 DIAGNOSIS — I789 Disease of capillaries, unspecified: Secondary | ICD-10-CM | POA: Diagnosis not present

## 2018-01-17 DIAGNOSIS — D229 Melanocytic nevi, unspecified: Secondary | ICD-10-CM | POA: Diagnosis not present

## 2018-01-17 DIAGNOSIS — L578 Other skin changes due to chronic exposure to nonionizing radiation: Secondary | ICD-10-CM | POA: Diagnosis not present

## 2018-01-17 DIAGNOSIS — L814 Other melanin hyperpigmentation: Secondary | ICD-10-CM | POA: Diagnosis not present

## 2018-01-17 DIAGNOSIS — D485 Neoplasm of uncertain behavior of skin: Secondary | ICD-10-CM | POA: Diagnosis not present

## 2018-01-17 DIAGNOSIS — L918 Other hypertrophic disorders of the skin: Secondary | ICD-10-CM | POA: Diagnosis not present

## 2018-01-17 DIAGNOSIS — L821 Other seborrheic keratosis: Secondary | ICD-10-CM | POA: Diagnosis not present

## 2018-02-01 DIAGNOSIS — M20021 Boutonniere deformity of right finger(s): Secondary | ICD-10-CM | POA: Diagnosis not present

## 2018-02-08 DIAGNOSIS — M20021 Boutonniere deformity of right finger(s): Secondary | ICD-10-CM | POA: Diagnosis not present

## 2018-02-21 DIAGNOSIS — M20021 Boutonniere deformity of right finger(s): Secondary | ICD-10-CM | POA: Diagnosis not present

## 2018-02-24 DIAGNOSIS — M20021 Boutonniere deformity of right finger(s): Secondary | ICD-10-CM | POA: Diagnosis not present

## 2018-03-10 DIAGNOSIS — M20021 Boutonniere deformity of right finger(s): Secondary | ICD-10-CM | POA: Diagnosis not present

## 2018-03-22 DIAGNOSIS — M20021 Boutonniere deformity of right finger(s): Secondary | ICD-10-CM | POA: Diagnosis not present

## 2018-03-22 DIAGNOSIS — S66312D Strain of extensor muscle, fascia and tendon of right middle finger at wrist and hand level, subsequent encounter: Secondary | ICD-10-CM | POA: Diagnosis not present

## 2018-03-31 DIAGNOSIS — M25641 Stiffness of right hand, not elsewhere classified: Secondary | ICD-10-CM | POA: Diagnosis not present

## 2018-03-31 DIAGNOSIS — M20021 Boutonniere deformity of right finger(s): Secondary | ICD-10-CM | POA: Diagnosis not present

## 2018-05-26 DIAGNOSIS — M25641 Stiffness of right hand, not elsewhere classified: Secondary | ICD-10-CM | POA: Diagnosis not present

## 2018-05-26 DIAGNOSIS — M20021 Boutonniere deformity of right finger(s): Secondary | ICD-10-CM | POA: Diagnosis not present

## 2018-06-02 DIAGNOSIS — M25641 Stiffness of right hand, not elsewhere classified: Secondary | ICD-10-CM | POA: Diagnosis not present

## 2018-06-02 DIAGNOSIS — M20021 Boutonniere deformity of right finger(s): Secondary | ICD-10-CM | POA: Diagnosis not present

## 2018-06-09 DIAGNOSIS — M20021 Boutonniere deformity of right finger(s): Secondary | ICD-10-CM | POA: Diagnosis not present

## 2018-06-09 DIAGNOSIS — M25641 Stiffness of right hand, not elsewhere classified: Secondary | ICD-10-CM | POA: Diagnosis not present

## 2018-06-23 DIAGNOSIS — M20021 Boutonniere deformity of right finger(s): Secondary | ICD-10-CM | POA: Diagnosis not present

## 2018-06-23 DIAGNOSIS — M25641 Stiffness of right hand, not elsewhere classified: Secondary | ICD-10-CM | POA: Diagnosis not present

## 2018-07-07 DIAGNOSIS — M25641 Stiffness of right hand, not elsewhere classified: Secondary | ICD-10-CM | POA: Diagnosis not present

## 2018-07-07 DIAGNOSIS — M20021 Boutonniere deformity of right finger(s): Secondary | ICD-10-CM | POA: Diagnosis not present

## 2018-08-02 ENCOUNTER — Other Ambulatory Visit: Payer: Self-pay

## 2018-08-02 ENCOUNTER — Encounter: Payer: Self-pay | Admitting: Family Medicine

## 2018-08-02 ENCOUNTER — Ambulatory Visit (INDEPENDENT_AMBULATORY_CARE_PROVIDER_SITE_OTHER): Payer: Medicare Other | Admitting: Family Medicine

## 2018-08-02 VITALS — BP 132/76 | HR 66 | Temp 99.3°F | Resp 17 | Wt 230.2 lb

## 2018-08-02 DIAGNOSIS — N529 Male erectile dysfunction, unspecified: Secondary | ICD-10-CM | POA: Diagnosis not present

## 2018-08-02 DIAGNOSIS — F419 Anxiety disorder, unspecified: Secondary | ICD-10-CM | POA: Diagnosis not present

## 2018-08-02 DIAGNOSIS — E781 Pure hyperglyceridemia: Secondary | ICD-10-CM | POA: Diagnosis not present

## 2018-08-02 MED ORDER — PAROXETINE HCL 40 MG PO TABS: 40.0000 mg | ORAL_TABLET | ORAL | 0 refills | Status: DC

## 2018-08-02 MED ORDER — TADALAFIL 5 MG PO TABS
5.0000 mg | ORAL_TABLET | ORAL | 1 refills | Status: DC | PRN
Start: 2018-08-02 — End: 2018-11-28

## 2018-08-02 NOTE — Patient Instructions (Addendum)
.   Please review the attached list of medications and notify my office if there are any errors.   . Please bring all of your medications to every appointment so we can make sure that our medication list is the same as yours.   . We will have flu vaccines available after Labor Day. Please go to your pharmacy or call the office in early September to schedule you flu shot.  Marland Kitchen You can install the GoodRx app on your smart phone to find the lowest prices for generic medications.

## 2018-08-02 NOTE — Progress Notes (Signed)
Patient: Earl Crawford Male    DOB: [birth year]   68 y.o.   MRN: [id number] Visit Date: 08/02/2018  Today's Provider: Lelon Huh, MD   Chief Complaint  Patient presents with  . Erectile Dysfunction   Subjective:     HPI  Patient states that he has been experiencing erectile dysfunction for the last 6 month which has been gradually getting worse. He states his libido is normal. Has not been anxious or depressed. Has been on paroxetine for 20 years or more by his account primarily for anxiety. Has been very effective.  Denies any other problems, specifically chest pains, dyspnea and clauications  Results for orders placed or performed in visit on 10/25/17  Comprehensive metabolic panel  Result Value Ref Range   Glucose 91 65 - 99 mg/dL   BUN 16 8 - 27 mg/dL   Creatinine, Ser 1.32 (H) 0.76 - 1.27 mg/dL   GFR calc non Af Amer 55 (L) >59 mL/min/1.73   GFR calc Af Amer 64 >59 mL/min/1.73   BUN/Creatinine Ratio 12 10 - 24   Sodium 141 134 - 144 mmol/L   Potassium 4.3 3.5 - 5.2 mmol/L   Chloride 103 96 - 106 mmol/L   CO2 23 20 - 29 mmol/L   Calcium 9.8 8.6 - 10.2 mg/dL   Total Protein 6.9 6.0 - 8.5 g/dL   Albumin 4.9 (H) 3.6 - 4.8 g/dL   Globulin, Total 2.0 1.5 - 4.5 g/dL   Albumin/Globulin Ratio 2.5 (H) 1.2 - 2.2   Bilirubin Total 0.9 0.0 - 1.2 mg/dL   Alkaline Phosphatase 49 39 - 117 IU/L   AST 21 0 - 40 IU/L   ALT 27 0 - 44 IU/L  Lipid panel  Result Value Ref Range   Cholesterol, Total 136 100 - 199 mg/dL   Triglycerides 259 (H) 0 - 149 mg/dL   HDL 33 (L) >39 mg/dL   VLDL Cholesterol Cal 52 (H) 5 - 40 mg/dL   LDL Calculated 51 0 - 99 mg/dL   Chol/HDL Ratio 4.1 0.0 - 5.0 ratio  PSA  Result Value Ref Range   Prostate Specific Ag, Serum 0.4 0.0 - 4.0 ng/mL     No Known Allergies   Current Outpatient Medications:  .  aspirin 81 MG tablet, Take 1 tablet by mouth daily., Disp: , Rfl:  .  atorvastatin (LIPITOR) 20 MG tablet, TAKE 1 TABLET BY MOUTH ONCE  DAILY, Disp: 90 tablet, Rfl: 4 .  Cholecalciferol (VITAMIN D3) 5000 units CAPS, Take 1 capsule by mouth daily., Disp: , Rfl:  .  Multiple Vitamins-Minerals (MULTIVITAMIN ADULT PO), Take 1 tablet by mouth daily., Disp: , Rfl:  .  Omega-3 Fatty Acids (FISH OIL) 1200 MG CAPS, Take 1 capsule by mouth daily., Disp: , Rfl:  .  PARoxetine (PAXIL) 40 MG tablet, TAKE 1 TABLET BY MOUTH ONCE DAILY, Disp: 90 tablet, Rfl: 4 .  traZODone (DESYREL) 150 MG tablet, TAKE 1/2 TO 1 (ONE-HALF TO ONE) TABLET BY MOUTH AT BEDTIME, Disp: 90 tablet, Rfl: 4 .  cetirizine (ZYRTEC) 10 MG tablet, Take 1 tablet (10 mg total) by mouth daily. (Patient not taking: Reported on 08/02/2018), Disp: 30 tablet, Rfl: 11 .  fluticasone (FLONASE) 50 MCG/ACT nasal spray, Place 2 sprays into both nostrils daily. (Patient not taking: Reported on 08/02/2018), Disp: 16 g, Rfl: 6  Review of Systems  Constitutional: Negative for appetite change, chills and fever.  Respiratory: Negative for chest tightness, shortness of  breath and wheezing.   Cardiovascular: Negative for chest pain and palpitations.  Gastrointestinal: Negative for abdominal pain, nausea and vomiting.    Social History   Tobacco Use  . Smoking status: Former Research scientist (life sciences)  . Smokeless tobacco: Never Used  . Tobacco comment: 1 year in college  Substance Use Topics  . Alcohol use: Yes    Alcohol/week: 0.0 standard drinks    Comment: occasional use, 1-2 drink/mo      Objective:   BP 132/76 (BP Location: Left Arm, Patient Position: Sitting, Cuff Size: Large)   Pulse 66   Temp 99.3 F (37.4 C) (Oral)   Resp 17   Wt 230 lb 3.2 oz (104.4 kg)   SpO2 97%   BMI 33.03 kg/m  Vitals:   08/02/18 1347  BP: 132/76  Pulse: 66  Resp: 17  Temp: 99.3 F (37.4 C)  TempSrc: Oral  SpO2: 97%  Weight: 230 lb 3.2 oz (104.4 kg)     Physical Exam    General Appearance:    Alert, cooperative, no distress, obese  Eyes:    PERRL, conjunctiva/corneas clear, EOM's intact       Lungs:      Clear to auscultation bilaterally, respirations unlabored  Heart:    Regular rate and rhythm, no murmurs  Neurologic:   Awake, alert, oriented x 3. No apparent focal neurological           defect.   Vascular:  +3 pulses and cap refill <2 sec        Assessment & Plan    1. Erectile dysfunction, unspecified erectile dysfunction type No sign of any vascular or mood disorders. Counseled regarding this being a potential adverse effect of paroxetine which he has taken for many years, but is interested in weaning as below.   - TSH - Testosterone,Free and Total try- tadalafil (CIALIS) 5 MG tablet; Take 1 tablet (5 mg total) by mouth every other day as needed for erectile dysfunction.  Dispense: 30 tablet; Refill: 1  2. Anxiety Very well controlled on paroxetine and expect that he would do well with lower dose or possible weaning off altogether will reduce to QOD.       Lelon Huh, MD  [facility]

## 2018-08-04 ENCOUNTER — Telehealth: Payer: Self-pay

## 2018-08-04 ENCOUNTER — Other Ambulatory Visit: Payer: Self-pay | Admitting: Family Medicine

## 2018-08-04 DIAGNOSIS — E291 Testicular hypofunction: Secondary | ICD-10-CM | POA: Insufficient documentation

## 2018-08-04 DIAGNOSIS — Z125 Encounter for screening for malignant neoplasm of prostate: Secondary | ICD-10-CM

## 2018-08-04 DIAGNOSIS — R948 Abnormal results of function studies of other organs and systems: Secondary | ICD-10-CM | POA: Diagnosis not present

## 2018-08-04 LAB — TESTOSTERONE,FREE AND TOTAL
Testosterone, Free: 3.9 pg/mL — ABNORMAL LOW (ref 6.6–18.1)
Testosterone: 172 ng/dL — ABNORMAL LOW (ref 264–916)

## 2018-08-04 LAB — TSH: TSH: 3.5 u[IU]/mL (ref 0.450–4.500)

## 2018-08-04 NOTE — Progress Notes (Signed)
there's a bug in the ABN module. it always says that when a PSA is ordered on a Part B Medicare patient. In this case it needs to go under 'Hypogonadism'. it wont be covered as screening since its not been over a year. when the red Order Validation box pops up, click on 'Waiver Form', then change the Notice status to 'ABN triggered incorrectly'

## 2018-08-04 NOTE — Telephone Encounter (Signed)
Pt advised.   Thanks,   -Tegan Britain  

## 2018-08-04 NOTE — Progress Notes (Signed)
Pt advised of lab results.  I cannot get the PSA to pass his insurance.  Is there something else I can try?  (I tried prostate cancer screening)  Please advise.   Thanks,   -Mickel Baas

## 2018-08-04 NOTE — Telephone Encounter (Signed)
-----   Message from Birdie Sons, MD sent at 08/04/2018  7:58 AM EDT ----- Testosterone levels are pretty low which is probably making ED much worse. Would probably due well to take testosterone replacement. Insurance requires recheck testosterone levels first to confirm they are low and to check other hormones that can cause low testosterone. Have entered pended order, please print and leave a t lab.

## 2018-08-04 NOTE — Progress Notes (Signed)
See result note. Patient needs additional labs due to low testosterone

## 2018-08-05 ENCOUNTER — Telehealth: Payer: Self-pay

## 2018-08-05 MED ORDER — ANDRODERM 2 MG/24HR TD PT24: 1.0000 | MEDICATED_PATCH | Freq: Every day | TRANSDERMAL | 3 refills | Status: DC

## 2018-08-05 NOTE — Addendum Note (Signed)
Addended by: Birdie Sons on: 08/05/2018 01:14 PM   Modules accepted: Orders

## 2018-08-05 NOTE — Telephone Encounter (Signed)
Pt advised.   Thanks,   -Ambry Dix  

## 2018-08-05 NOTE — Telephone Encounter (Signed)
Yes, I had to wait until today to add the test.    Thanks,   -Mickel Baas

## 2018-08-05 NOTE — Telephone Encounter (Signed)
Advised patient of results. Patient wanted to know if the PSA was added to his labs? Please advise. Thanks!

## 2018-08-05 NOTE — Telephone Encounter (Signed)
-----   Message from Birdie Sons, MD sent at 08/05/2018  1:15 PM EDT ----- Labs confirm low testosterone, have send prescription for androderm daily patch to [facility]. It may take a few days for insurance to approve, usually requires prior authorization.  Will need to return in 4 weeks to check testosterone levels.

## 2018-08-05 NOTE — Progress Notes (Signed)
Pt came in sooner then I expected. I talked with Langley Gauss ([facility]) I dropped the psa so she would be able to get the order on her end.  She said I could call and have the PSA added to is labs.  I called [facility] and was able to add the test.

## 2018-08-05 NOTE — Telephone Encounter (Signed)
Ok, can you let the patient know, PSa result should be back on Monday. Thanks.

## 2018-08-05 NOTE — Telephone Encounter (Signed)
Was PSA added. Didn't get result

## 2018-08-06 LAB — CBC
Hematocrit: 45.5 % (ref 37.5–51.0)
Hemoglobin: 16.1 g/dL (ref 13.0–17.7)
MCH: 32.1 pg (ref 26.6–33.0)
MCHC: 35.4 g/dL (ref 31.5–35.7)
MCV: 91 fL (ref 79–97)
Platelets: 157 10*3/uL (ref 150–450)
RBC: 5.02 x10E6/uL (ref 4.14–5.80)
RDW: 13 % (ref 11.6–15.4)
WBC: 6 10*3/uL (ref 3.4–10.8)

## 2018-08-06 LAB — PROLACTIN: Prolactin: 9.3 ng/mL (ref 4.0–15.2)

## 2018-08-06 LAB — TESTOSTERONE,FREE AND TOTAL
Testosterone, Free: 2.4 pg/mL — ABNORMAL LOW (ref 6.6–18.1)
Testosterone: 143 ng/dL — ABNORMAL LOW (ref 264–916)

## 2018-08-06 LAB — LUTEINIZING HORMONE: LH: 2.1 m[IU]/mL (ref 1.7–8.6)

## 2018-08-08 ENCOUNTER — Telehealth: Payer: Self-pay

## 2018-08-08 NOTE — Telephone Encounter (Signed)
-----   Message from Birdie Sons, MD sent at 08/06/2018  8:33 PM EDT ----- PSA is normal. Has to monitored about every 3-6 months while on testosterone

## 2018-08-08 NOTE — Telephone Encounter (Signed)
Pt advised.   Thanks,   -Helena Sardo  

## 2018-08-16 LAB — PSA: Prostate Specific Ag, Serum: 0.4 ng/mL (ref 0.0–4.0)

## 2018-08-16 LAB — SPECIMEN STATUS REPORT

## 2018-09-01 ENCOUNTER — Other Ambulatory Visit: Payer: Self-pay | Admitting: Family Medicine

## 2018-09-23 ENCOUNTER — Encounter: Payer: Self-pay | Admitting: Family Medicine

## 2018-09-23 DIAGNOSIS — R948 Abnormal results of function studies of other organs and systems: Secondary | ICD-10-CM | POA: Insufficient documentation

## 2018-10-27 ENCOUNTER — Ambulatory Visit: Payer: Medicare Other

## 2018-10-27 ENCOUNTER — Encounter: Payer: Medicare Other | Admitting: Family Medicine

## 2018-11-02 DIAGNOSIS — Z23 Encounter for immunization: Secondary | ICD-10-CM | POA: Diagnosis not present

## 2018-11-28 ENCOUNTER — Other Ambulatory Visit: Payer: Self-pay | Admitting: Family Medicine

## 2018-11-28 DIAGNOSIS — N529 Male erectile dysfunction, unspecified: Secondary | ICD-10-CM

## 2018-12-14 ENCOUNTER — Other Ambulatory Visit: Payer: Self-pay

## 2018-12-27 NOTE — Progress Notes (Signed)
Subjective:   Earl Crawford is a 68 y.o. male who presents for Medicare Annual/Subsequent preventive examination.  Review of Systems:  N/A  Cardiac Risk Factors include: advanced age (>31men, >47 women);dyslipidemia;male gender     Objective:    Vitals: BP 122/74 (BP Location: Right Arm)   Pulse 74   Temp 98.1 F (36.7 C) (Oral)   Ht 5\' 10"  (1.778 m)   Wt 232 lb (105.2 kg)   BMI 33.29 kg/m   Body mass index is 33.29 kg/m.  Advanced Directives 12/28/2018 10/25/2017 07/06/2016 05/13/2015  Does Patient Have a Medical Advance Directive? Yes Yes Yes Yes  Type of Paramedic of [place];Living will [place];Living will [place];Living will [place];Living will  Does patient want to make changes to medical advance directive? - - No - Patient declined No - Patient declined  Copy of [place] in Chart? No - copy requested No - copy requested No - copy requested -    Tobacco Social History   Tobacco Use  Smoking Status Former Smoker  Smokeless Tobacco Never Used  Tobacco Comment   1 year in college     Counseling given: Not Answered Comment: 1 year in college   Clinical Intake:  Pre-visit preparation completed: Yes  Pain : No/denies pain Pain Score: 0-No pain     Nutritional Status: BMI > 30  Obese Nutritional Risks: None Diabetes: No  How often do you need to have someone help you when you read instructions, pamphlets, or other written materials from your doctor or pharmacy?: 1 - Never  Interpreter Needed?: No  Information entered by :: [facility], LPN  Past Medical History:  Diagnosis Date  . Asthma    as child  . Benign neoplasm of transverse colon   . Bilateral impacted cerumen 11/02/2016  . History of measles   . History of mumps   . Sleep apnea    not currently using CPAP "too noisy"  . Tinnitus    Past Surgical History:  Procedure Laterality  Date  . COLONOSCOPY    . COLONOSCOPY WITH PROPOFOL N/A 07/06/2016   Procedure: COLONOSCOPY WITH PROPOFOL;  Surgeon: Lucilla Lame, MD;  Location: [facility];  Service: Endoscopy;  Laterality: N/A;  sleep apnea  . HERNIA REPAIR  1970   herniorrhapy  . POLYPECTOMY  07/06/2016   Procedure: POLYPECTOMY;  Surgeon: Lucilla Lame, MD;  Location: [facility];  Service: Endoscopy;;   Family History  Problem Relation Age of Onset  . Macular degeneration Mother   . Memory loss Mother   . Heart attack Father   . Hypertension Father   . Obesity Father   . Diabetes Other   . Cancer Neg Hx    Social History   Socioeconomic History  . Marital status: Married    Spouse name: Not on file  . Number of children: 2  . Years of education: Not on file  . Highest education level: Bachelor's degree (e.g., BA, AB, BS)  Occupational History  . Occupation: Journalist, newspaper    Comment: Retired 2019  Social Needs  . Financial resource strain: Not hard at all  . Food insecurity    Worry: Never true    Inability: Never true  . Transportation needs    Medical: No    Non-medical: No  Tobacco Use  . Smoking status: Former Research scientist (life sciences)  . Smokeless tobacco: Never Used  . Tobacco comment: 1 year  in college  Substance and Sexual Activity  . Alcohol use: Yes    Alcohol/week: 0.0 standard drinks    Comment: occasional use, 1-2 drink/mo  . Drug use: No  . Sexual activity: Not on file  Lifestyle  . Physical activity    Days per week: 4 days    Minutes per session: 30 min  . Stress: Not at all  Relationships  . Social Herbalist on phone: Patient refused    Gets together: Patient refused    Attends religious service: Patient refused    Active member of club or organization: Patient refused    Attends meetings of clubs or organizations: Patient refused    Relationship status: Patient refused  Other Topics Concern  . Not on file  Social History Narrative  . Not on file     Outpatient Encounter Medications as of 12/28/2018  Medication Sig  . aspirin 81 MG tablet Take 1 tablet by mouth daily.  Marland Kitchen atorvastatin (LIPITOR) 20 MG tablet TAKE 1 TABLET BY MOUTH ONCE DAILY  . Cholecalciferol (VITAMIN D3) 5000 units CAPS Take 1 capsule by mouth daily.  Noelle Penner ALLERGY RELIEF, CETIRIZINE, 10 MG tablet Take 1 tablet by mouth once daily  . Multiple Vitamins-Minerals (MULTIVITAMIN ADULT PO) Take 1 tablet by mouth daily.  . NON FORMULARY CBD oil daily  . Omega-3 Fatty Acids (FISH OIL) 1200 MG CAPS Take 1 capsule by mouth daily.  Marland Kitchen PARoxetine (PAXIL) 40 MG tablet Take 1 tablet (40 mg total) by mouth every other day.  . tadalafil (CIALIS) 5 MG tablet TAKE 1 TABLET BY MOUTH EVERY OTHER DAY AS NEEDED FOR ERECTILE DYSFUNCTION  . traZODone (DESYREL) 150 MG tablet TAKE 1/2 TO 1 (ONE-HALF TO ONE) TABLET BY MOUTH AT BEDTIME  . Testosterone (ANDRODERM) 2 MG/24HR PT24 Place 1 patch onto the skin daily. (Patient not taking: Reported on 12/28/2018)   No facility-administered encounter medications on file as of 12/28/2018.     Activities of Daily Living In your present state of health, do you have any difficulty performing the following activities: 12/28/2018  Hearing? Y  Comment Due to tinnitus.  Vision? N  Difficulty concentrating or making decisions? N  Walking or climbing stairs? N  Dressing or bathing? N  Doing errands, shopping? N  Preparing Food and eating ? N  Using the Toilet? N  In the past six months, have you accidently leaked urine? N  Do you have problems with loss of bowel control? N  Managing your Medications? N  Managing your Finances? N  Housekeeping or managing your Housekeeping? N  Some recent data might be hidden    Patient Care Team: Birdie Sons, MD as PCP - General (Family Medicine) Lucilla Lame, MD as Consulting Physician (Gastroenterology) [facility], [facility]   Assessment:   This is a routine wellness examination for [facility].  Exercise  Activities and Dietary recommendations Current Exercise Habits: Home exercise routine, Type of exercise: walking, Time (Minutes): 35, Frequency (Times/Week): 4, Weekly Exercise (Minutes/Week): 140, Intensity: Mild, Exercise limited by: None identified  Goals    . DIET - REDUCE SUGAR INTAKE     Recommend to decrease ice cream intake to a couple times weekly and to get low or no sugar types.        Fall Risk: Fall Risk  12/28/2018 12/08/2017 10/25/2017 05/18/2016 05/13/2015  Falls in the past year? 0 1 No No No  Comment - Emmi Telephone Survey: data to providers prior  to load - - -  Number falls in past yr: 0 1 - - -  Comment - Emmi Telephone Survey Actual Response = 1 - - -  Injury with Fall? 0 1 - - -    FALL RISK PREVENTION PERTAINING TO THE HOME:  Any stairs in or around the home? Yes  If so, are there any without handrails? No   Home free of loose throw rugs in walkways, pet beds, electrical cords, etc? Yes  Adequate lighting in your home to reduce risk of falls? Yes   ASSISTIVE DEVICES UTILIZED TO PREVENT FALLS:  Life alert? No  Use of a cane, walker or w/c? No  Grab bars in the bathroom? No  Shower chair or bench in shower? Yes  Elevated toilet seat or a handicapped toilet? No   TIMED UP AND GO:  Was the test performed? No .    Depression Screen PHQ 2/9 Scores 12/28/2018 10/25/2017 05/18/2016 05/13/2015  PHQ - 2 Score 0 0 0 0  PHQ- 9 Score - - 0 1    Cognitive Function: Declined today.      6CIT Screen 10/25/2017  What Year? 0 points  What month? 0 points  What time? 0 points  Count back from 20 0 points  Months in reverse 0 points  Repeat phrase 0 points  Total Score 0    Immunization History  Administered Date(s) Administered  . H1N1 11/24/2007  . Influenza Split 12/04/2006, 02/08/2009, 12/16/2009, 11/19/2010  . Influenza, High Dose Seasonal PF 11/02/2016, 10/25/2017  . Influenza-Unspecified 11/17/2014  . Pneumococcal Conjugate-13 02/19/2015  .  Pneumococcal Polysaccharide-23 05/18/2016  . Td 01/23/2000  . Tdap 05/04/2008  . Zoster 02/27/2010    Qualifies for Shingles Vaccine? Yes  Zostavax completed 02/27/10. Due for Shingrix. Education has been provided regarding the importance of this vaccine. Pt has been advised to call insurance company to determine out of pocket expense. Advised may also receive vaccine at local pharmacy or Health Dept. Verbalized acceptance and understanding.  Tdap: Although this vaccine is not a covered service during a Wellness Exam, does the patient still wish to receive this vaccine today?  No .  Education has been provided regarding the importance of this vaccine. Advised may receive this vaccine at local pharmacy or Health Dept. Aware to provide a copy of the vaccination record if obtained from local pharmacy or Health Dept. Verbalized acceptance and understanding.  Flu Vaccine: Up to date  Pneumococcal Vaccine: Completed series  Screening Tests Health Maintenance  Topic Date Due  . TETANUS/TDAP  12/28/2019 (Originally 05/05/2018)  . COLONOSCOPY  07/06/2021  . INFLUENZA VACCINE  Completed  . Hepatitis C Screening  Completed  . PNA vac Low Risk Adult  Completed   Cancer Screenings:  Colorectal Screening: Completed 07/06/16. Repeat every 5 years.  Lung Cancer Screening: (Low Dose CT Chest recommended if Age 50-80 years, 30 pack-year currently smoking OR have quit w/in 15years.) does not qualify.   Additional Screening:  Hepatitis C Screening: Up to date  Vision Screening: Recommended annual ophthalmology exams for early detection of glaucoma and other disorders of the eye.  Dental Screening: Recommended annual dental exams for proper oral hygiene  Community Resource Referral:  CRR required this visit?  No        Plan:  I have personally reviewed and addressed the Medicare Annual Wellness questionnaire and have noted the following in the patient's chart:  A. Medical and social history B.  Use of alcohol, tobacco or illicit  drugs  C. Current medications and supplements D. Functional ability and status E.  Nutritional status F.  Physical activity G. Advance directives H. List of other physicians I.  Hospitalizations, surgeries, and ER visits in previous 12 months J.  [place] such as hearing and vision if needed, cognitive and depression L. Referrals and appointments   In addition, I have reviewed and discussed with patient certain preventive protocols, quality metrics, and best practice recommendations. A written personalized care plan for preventive services as well as general preventive health recommendations were provided to patient.   Glendora Score, LPN  [id number] Nurse Health Advisor   Nurse Notes: None.

## 2018-12-28 ENCOUNTER — Ambulatory Visit (INDEPENDENT_AMBULATORY_CARE_PROVIDER_SITE_OTHER): Payer: Medicare Other

## 2018-12-28 ENCOUNTER — Ambulatory Visit (INDEPENDENT_AMBULATORY_CARE_PROVIDER_SITE_OTHER): Payer: Medicare Other | Admitting: Family Medicine

## 2018-12-28 ENCOUNTER — Other Ambulatory Visit: Payer: Self-pay

## 2018-12-28 VITALS — BP 122/74 | HR 74 | Temp 98.1°F | Ht 70.0 in | Wt 232.0 lb

## 2018-12-28 DIAGNOSIS — G4700 Insomnia, unspecified: Secondary | ICD-10-CM

## 2018-12-28 DIAGNOSIS — E781 Pure hyperglyceridemia: Secondary | ICD-10-CM

## 2018-12-28 DIAGNOSIS — G4733 Obstructive sleep apnea (adult) (pediatric): Secondary | ICD-10-CM

## 2018-12-28 DIAGNOSIS — Z0000 Encounter for general adult medical examination without abnormal findings: Secondary | ICD-10-CM

## 2018-12-28 DIAGNOSIS — Z9989 Dependence on other enabling machines and devices: Secondary | ICD-10-CM

## 2018-12-28 DIAGNOSIS — F419 Anxiety disorder, unspecified: Secondary | ICD-10-CM

## 2018-12-28 NOTE — Progress Notes (Signed)
Patient: Earl Crawford Male    DOB: 1950-10-13   67 y.o.   MRN: [id number] Visit Date: 12/28/2018  Today's Provider: Lelon Huh, MD   No chief complaint on file.  Subjective:     HPI  Follow up for Hypogonadism/ Low testosterone:  The patient was last seen for this 4 months ago. Changes made at last visit include starting Androderm.  He reports good compliance with treatment. Patient states he is not taking the Androderm patches. He is taking Cialis daily which is working very well for him and he wishes to continue.  He feels that condition is stable. He is not having side effects.   ------------------------------------------------------------------------------------  Follow up for Anxiety:  The patient was last seen for this 4 months ago. Changes made at last visit include reducing Paroxetine to every other day.  He reports good compliance with treatment. He feels that condition is Improved. He would like to continue with current dosing regiment.  He is not having side effects.  Is also happy with 1/2 trazodone hs which helps him rest well and feels well rested in the morning.  ------------------------------------------------------------------------------   Follow up for OSA:  The patient was last seen for this 1 years ago. Changes made at last visit include none.  He reports good compliance with treatment. He feels that condition is Improved. He is not having side effects.   ------------------------------------------------------------------------------------  Lipid/Cholesterol, Follow-up:   Last seen for this 1 years ago.  Management changes since that visit include none. . Last Lipid Panel:    Component Value Date/Time   CHOL 136 10/25/2017 1053   TRIG 259 (H) 10/25/2017 1053   HDL 33 (L) 10/25/2017 1053   CHOLHDL 4.1 10/25/2017 1053   LDLCALC 51 10/25/2017 1053    Risk factors for vascular disease include hypercholesterolemia  He  reports good compliance with treatment. He is not having side effects.  Current symptoms include none and have been stable. Weight trend: stable Prior visit with dietician: no Current diet: well balanced Current exercise: walking  Wt Readings from Last 3 Encounters:  12/28/18 232 lb (105.2 kg)  08/02/18 230 lb 3.2 oz (104.4 kg)  10/25/17 238 lb 3.2 oz (108 kg)    -------------------------------------------------------------------  No Known Allergies   Current Outpatient Medications:  .  aspirin 81 MG tablet, Take 1 tablet by mouth daily., Disp: , Rfl:  .  atorvastatin (LIPITOR) 20 MG tablet, TAKE 1 TABLET BY MOUTH ONCE DAILY, Disp: 90 tablet, Rfl: 4 .  Cholecalciferol (VITAMIN D3) 5000 units CAPS, Take 1 capsule by mouth daily., Disp: , Rfl:  .  EQ ALLERGY RELIEF, CETIRIZINE, 10 MG tablet, Take 1 tablet by mouth once daily, Disp: 90 tablet, Rfl: 0 .  Multiple Vitamins-Minerals (MULTIVITAMIN ADULT PO), Take 1 tablet by mouth daily., Disp: , Rfl:  .  NON FORMULARY, CBD oil daily, Disp: , Rfl:  .  Omega-3 Fatty Acids (FISH OIL) 1200 MG CAPS, Take 1 capsule by mouth daily., Disp: , Rfl:  .  PARoxetine (PAXIL) 40 MG tablet, Take 1 tablet (40 mg total) by mouth every other day., Disp: 3 tablet, Rfl: 0 .  tadalafil (CIALIS) 5 MG tablet, TAKE 1 TABLET BY MOUTH EVERY OTHER DAY AS NEEDED FOR ERECTILE DYSFUNCTION, Disp: 30 tablet, Rfl: 12 .  traZODone (DESYREL) 150 MG tablet, TAKE 1/2 TO 1 (ONE-HALF TO ONE) TABLET BY MOUTH AT BEDTIME, Disp: 90 tablet, Rfl: 4 .  Testosterone (ANDRODERM) 2 MG/24HR PT24,  Place 1 patch onto the skin daily. (Patient not taking: Reported on 12/28/2018), Disp: 30 patch, Rfl: 3  Review of Systems  Constitutional: Negative for appetite change, chills, fatigue and fever.  HENT: Negative for congestion, ear pain, hearing loss, nosebleeds and trouble swallowing.   Eyes: Negative for pain and visual disturbance.  Respiratory: Negative for cough, chest tightness and  shortness of breath.   Cardiovascular: Negative for chest pain, palpitations and leg swelling.  Gastrointestinal: Negative for abdominal pain, blood in stool, constipation, diarrhea, nausea and vomiting.  Endocrine: Negative for polydipsia, polyphagia and polyuria.  Genitourinary: Negative for dysuria and flank pain.  Musculoskeletal: Negative for arthralgias, back pain, joint swelling, myalgias and neck stiffness.  Skin: Negative for color change, rash and wound.  Neurological: Negative for dizziness, tremors, seizures, speech difficulty, weakness, light-headedness and headaches.  Psychiatric/Behavioral: Negative for behavioral problems, confusion, decreased concentration, dysphoric mood and sleep disturbance. The patient is not nervous/anxious.   All other systems reviewed and are negative.   Social History   Tobacco Use  . Smoking status: Former Research scientist (life sciences)  . Smokeless tobacco: Never Used  . Tobacco comment: 1 year in college  Substance Use Topics  . Alcohol use: Yes    Alcohol/week: 0.0 standard drinks    Comment: occasional use, 1-2 drink/mo      Objective:   There were no vitals taken for this visit. There were no vitals filed for this visit.There is no height or weight on file to calculate BMI. Vitals:   BP 122/74 (BP Location: Right Arm)  Pulse 74  Temp 98.1 F (36.7 C) (Oral)  Ht 5\' 10"  (1.778 m)  Wt 232 lb (105.2 kg)  BMI 33.29 kg/m  BSA 2.28 m       Physical Exam   General Appearance:    Obese male in no acute distress  Eyes:    PERRL, conjunctiva/corneas clear, EOM's intact       Lungs:     Clear to auscultation bilaterally, respirations unlabored  Heart:    Normal heart rate. Normal rhythm. No murmurs, rubs, or gallops.   MS:   All extremities are intact.   Neurologic:   Awake, alert, oriented x 3. No apparent focal neurological           defect.           Assessment & Plan    1. Pure hyperglyceridemia He is tolerating atorvastatin well with no  adverse effects.   - Comprehensive metabolic panel - Lipid panel (fasting)  2. Anxiety Doing very well on QOD paroxetine which he wishes to continue.   3. OSA on CPAP Using CPAP consistently every night and medically benefiting from its use.   4. Insomnia, unspecified type Doing well on 1/2 150mg  trazodone every night.    The entirety of the information documented in the History of Present Illness, Review of Systems and Physical Exam were personally obtained by me. Portions of this information were initially documented by Meyer Cory, CMA and reviewed by me for thoroughness and accuracy.      Lelon Huh, MD  [facility]

## 2018-12-28 NOTE — Patient Instructions (Addendum)
Earl Crawford , Thank you for taking time to come for your Medicare Wellness Visit. I appreciate your ongoing commitment to your health goals. Please review the following plan we discussed and let me know if I can assist you in the future.   Screening recommendations/referrals: Colonoscopy: Up to date, due 06/2021 Recommended yearly ophthalmology/optometry visit for glaucoma screening and checkup Recommended yearly dental visit for hygiene and checkup  Vaccinations: Influenza vaccine: Up to date Pneumococcal vaccine: Completed series Tdap vaccine: Pt declines today.  Shingles vaccine: Pt declines today.     Advanced directives: Please bring a copy of your POA (Power of Attorney) and/or Living Will to your next appointment.   Conditions/risks identified: Continue to cut back on ice cream intake and stick with low or no sugar.   Next appointment: 9:00 AM today with Dr Caryn Section. Declined scheduling a CPE and AWV for 2021 at this time.   Preventive Care 23 Years and Older, Male Preventive care refers to lifestyle choices and visits with your health care provider that can promote health and wellness. What does preventive care include?  A yearly physical exam. This is also called an annual well check.  Dental exams once or twice a year.  Routine eye exams. Ask your health care provider how often you should have your eyes checked.  Personal lifestyle choices, including:  Daily care of your teeth and gums.  Regular physical activity.  Eating a healthy diet.  Avoiding tobacco and drug use.  Limiting alcohol use.  Practicing safe sex.  Taking low doses of aspirin every day.  Taking vitamin and mineral supplements as recommended by your health care provider. What happens during an annual well check? The services and screenings done by your health care provider during your annual well check will depend on your age, overall health, lifestyle risk factors, and family history of  disease. Counseling  Your health care provider may ask you questions about your:  Alcohol use.  Tobacco use.  Drug use.  Emotional well-being.  Home and relationship well-being.  Sexual activity.  Eating habits.  History of falls.  Memory and ability to understand (cognition).  Work and work Statistician. Screening  You may have the following tests or measurements:  Height, weight, and BMI.  Blood pressure.  Lipid and cholesterol levels. These may be checked every 5 years, or more frequently if you are over 67 years old.  Skin check.  Lung cancer screening. You may have this screening every year starting at age 60 if you have a 30-pack-year history of smoking and currently smoke or have quit within the past 15 years.  Fecal occult blood test (FOBT) of the stool. You may have this test every year starting at age 85.  Flexible sigmoidoscopy or colonoscopy. You may have a sigmoidoscopy every 5 years or a colonoscopy every 10 years starting at age 8.  Prostate cancer screening. Recommendations will vary depending on your family history and other risks.  Hepatitis C blood test.  Hepatitis B blood test.  Sexually transmitted disease (STD) testing.  Diabetes screening. This is done by checking your blood sugar (glucose) after you have not eaten for a while (fasting). You may have this done every 1-3 years.  Abdominal aortic aneurysm (AAA) screening. You may need this if you are a current or former smoker.  Osteoporosis. You may be screened starting at age 2 if you are at high risk. Talk with your health care provider about your test results, treatment options, and if  necessary, the need for more tests. Vaccines  Your health care provider may recommend certain vaccines, such as:  Influenza vaccine. This is recommended every year.  Tetanus, diphtheria, and acellular pertussis (Tdap, Td) vaccine. You may need a Td booster every 10 years.  Zoster vaccine. You may  need this after age 45.  Pneumococcal 13-valent conjugate (PCV13) vaccine. One dose is recommended after age 36.  Pneumococcal polysaccharide (PPSV23) vaccine. One dose is recommended after age 68. Talk to your health care provider about which screenings and vaccines you need and how often you need them. This information is not intended to replace advice given to you by your health care provider. Make sure you discuss any questions you have with your health care provider. Document Released: 02/01/2015 Document Revised: 09/25/2015 Document Reviewed: 11/06/2014 Elsevier Interactive Patient Education  2017 [place] Prevention in the Home Falls can cause injuries. They can happen to people of all ages. There are many things you can do to make your home safe and to help prevent falls. What can I do on the outside of my home?  Regularly fix the edges of walkways and driveways and fix any cracks.  Remove anything that might make you trip as you walk through a door, such as a raised step or threshold.  Trim any bushes or trees on the path to your home.  Use bright outdoor lighting.  Clear any walking paths of anything that might make someone trip, such as rocks or tools.  Regularly check to see if handrails are loose or broken. Make sure that both sides of any steps have handrails.  Any raised decks and porches should have guardrails on the edges.  Have any leaves, snow, or ice cleared regularly.  Use sand or salt on walking paths during winter.  Clean up any spills in your garage right away. This includes oil or grease spills. What can I do in the bathroom?  Use night lights.  Install grab bars by the toilet and in the tub and shower. Do not use towel bars as grab bars.  Use non-skid mats or decals in the tub or shower.  If you need to sit down in the shower, use a plastic, non-slip stool.  Keep the floor dry. Clean up any water that spills on the floor as soon as it  happens.  Remove soap buildup in the tub or shower regularly.  Attach bath mats securely with double-sided non-slip rug tape.  Do not have throw rugs and other things on the floor that can make you trip. What can I do in the bedroom?  Use night lights.  Make sure that you have a light by your bed that is easy to reach.  Do not use any sheets or blankets that are too big for your bed. They should not hang down onto the floor.  Have a firm chair that has side arms. You can use this for support while you get dressed.  Do not have throw rugs and other things on the floor that can make you trip. What can I do in the kitchen?  Clean up any spills right away.  Avoid walking on wet floors.  Keep items that you use a lot in easy-to-reach places.  If you need to reach something above you, use a strong step stool that has a grab bar.  Keep electrical cords out of the way.  Do not use floor polish or wax that makes floors slippery. If you must  use wax, use non-skid floor wax.  Do not have throw rugs and other things on the floor that can make you trip. What can I do with my stairs?  Do not leave any items on the stairs.  Make sure that there are handrails on both sides of the stairs and use them. Fix handrails that are broken or loose. Make sure that handrails are as long as the stairways.  Check any carpeting to make sure that it is firmly attached to the stairs. Fix any carpet that is loose or worn.  Avoid having throw rugs at the top or bottom of the stairs. If you do have throw rugs, attach them to the floor with carpet tape.  Make sure that you have a light switch at the top of the stairs and the bottom of the stairs. If you do not have them, ask someone to add them for you. What else can I do to help prevent falls?  Wear shoes that:  Do not have high heels.  Have rubber bottoms.  Are comfortable and fit you well.  Are closed at the toe. Do not wear sandals.  If you  use a stepladder:  Make sure that it is fully opened. Do not climb a closed stepladder.  Make sure that both sides of the stepladder are locked into place.  Ask someone to hold it for you, if possible.  Clearly mark and make sure that you can see:  Any grab bars or handrails.  First and last steps.  Where the edge of each step is.  Use tools that help you move around (mobility aids) if they are needed. These include:  Canes.  Walkers.  Scooters.  Crutches.  Turn on the lights when you go into a dark area. Replace any light bulbs as soon as they burn out.  Set up your furniture so you have a clear path. Avoid moving your furniture around.  If any of your floors are uneven, fix them.  If there are any pets around you, be aware of where they are.  Review your medicines with your doctor. Some medicines can make you feel dizzy. This can increase your chance of falling. Ask your doctor what other things that you can do to help prevent falls. This information is not intended to replace advice given to you by your health care provider. Make sure you discuss any questions you have with your health care provider. Document Released: 11/01/2008 Document Revised: 06/13/2015 Document Reviewed: 02/09/2014 Elsevier Interactive Patient Education  2017 [employer].

## 2018-12-28 NOTE — Patient Instructions (Signed)
.   Please review the attached list of medications and notify my office if there are any errors.   . Please bring all of your medications to every appointment so we can make sure that our medication list is the same as yours.   Please go to the lab draw station in [address] on the second floor of [facility]. Normal hours are 8:00am to 12:30pm and 1:30pm to 4:00pm Monday through Friday

## 2018-12-29 LAB — COMPREHENSIVE METABOLIC PANEL
ALT: 21 IU/L (ref 0–44)
AST: 17 IU/L (ref 0–40)
Albumin/Globulin Ratio: 2.5 — ABNORMAL HIGH (ref 1.2–2.2)
Albumin: 4.7 g/dL (ref 3.8–4.8)
Alkaline Phosphatase: 48 IU/L (ref 39–117)
BUN/Creatinine Ratio: 14 (ref 10–24)
BUN: 18 mg/dL (ref 8–27)
Bilirubin Total: 0.9 mg/dL (ref 0.0–1.2)
CO2: 23 mmol/L (ref 20–29)
Calcium: 9.7 mg/dL (ref 8.6–10.2)
Chloride: 106 mmol/L (ref 96–106)
Creatinine, Ser: 1.25 mg/dL (ref 0.76–1.27)
GFR calc Af Amer: 68 mL/min/{1.73_m2} (ref >59)
GFR calc non Af Amer: 59 mL/min/{1.73_m2} — ABNORMAL LOW (ref >59)
Globulin, Total: 1.9 g/dL (ref 1.5–4.5)
Glucose: 91 mg/dL (ref 65–99)
Potassium: 4.1 mmol/L (ref 3.5–5.2)
Sodium: 143 mmol/L (ref 134–144)
Total Protein: 6.6 g/dL (ref 6.0–8.5)

## 2018-12-29 LAB — LIPID PANEL
Chol/HDL Ratio: 3.5 ratio (ref 0.0–5.0)
Cholesterol, Total: 116 mg/dL (ref 100–199)
HDL: 33 mg/dL — ABNORMAL LOW (ref >39)
LDL Chol Calc (NIH): 49 mg/dL (ref 0–99)
Triglycerides: 207 mg/dL — ABNORMAL HIGH (ref 0–149)
VLDL Cholesterol Cal: 34 mg/dL (ref 5–40)

## 2018-12-30 ENCOUNTER — Telehealth: Payer: Self-pay

## 2018-12-30 NOTE — Telephone Encounter (Signed)
LMTCB, okay for PEC to advise patient.  

## 2018-12-30 NOTE — Telephone Encounter (Signed)
Pt. Given results and instructions. Verbalizes understanding. 

## 2018-12-30 NOTE — Telephone Encounter (Signed)
-----   Message from Birdie Sons, MD sent at 12/29/2018  9:06 AM EST ----- Labs good, triglycerides slightly high. Continue current medications.  Avoid sweets and starchy foods. Check yearly.

## 2019-02-26 DIAGNOSIS — Z23 Encounter for immunization: Secondary | ICD-10-CM | POA: Diagnosis not present

## 2019-02-28 ENCOUNTER — Other Ambulatory Visit: Payer: Self-pay | Admitting: Family Medicine

## 2019-03-08 ENCOUNTER — Other Ambulatory Visit: Payer: Self-pay | Admitting: Family Medicine

## 2019-03-29 DIAGNOSIS — Z23 Encounter for immunization: Secondary | ICD-10-CM | POA: Diagnosis not present

## 2019-03-31 ENCOUNTER — Telehealth: Payer: Self-pay | Admitting: *Deleted

## 2019-03-31 NOTE — Telephone Encounter (Signed)
Pt wanted to notify his PCP that he received his second Moderna vaccine March 29, 2019.

## 2019-04-17 ENCOUNTER — Ambulatory Visit (INDEPENDENT_AMBULATORY_CARE_PROVIDER_SITE_OTHER): Payer: Medicare Other | Admitting: Family Medicine

## 2019-04-17 ENCOUNTER — Encounter: Payer: Self-pay | Admitting: Family Medicine

## 2019-04-17 ENCOUNTER — Other Ambulatory Visit: Payer: Self-pay

## 2019-04-17 VITALS — BP 128/82 | HR 56 | Temp 96.9°F | Wt 236.0 lb

## 2019-04-17 DIAGNOSIS — H6123 Impacted cerumen, bilateral: Secondary | ICD-10-CM

## 2019-04-17 NOTE — Progress Notes (Signed)
Earl Crawford  MRN: [id number] DOB: 1950/02/08  Subjective:  HPI   The patient is a 69 year old male who presents for evaluation of ear issues.  He states that he thinks that he has a build up cerumen.  He feels he also has some fluid in the ears.   Patient Active Problem List   Diagnosis Date Noted  . Abnormal results of function studies of other organs and systems 09/23/2018  . Hypogonadism in male 08/04/2018  . 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 History of adenomatous polyp of colon 05/18/2016  . Obesity 05/13/2015  . Anxiety 05/13/2015  . Psoriasis 02/19/2015  . Insomnia 02/18/2015  . Allergic rhinitis 05/07/2008  . Hypertrophic and atrophic condition of skin 10/21/2006  . OSA on CPAP 12/24/2003  . Tinnitus 11/22/2000  . Pure hyperglyceridemia 08/01/1992    Past Medical History:  Diagnosis Date  . Asthma    as child  . Benign neoplasm of transverse colon   . Bilateral impacted cerumen 11/02/2016  . History of measles   . History of mumps   . Sleep apnea    not currently using CPAP "too noisy"  . Tinnitus    Past Surgical History:  Procedure Laterality Date  . COLONOSCOPY    . COLONOSCOPY WITH PROPOFOL N/A 07/06/2016   Procedure: COLONOSCOPY WITH PROPOFOL;  Surgeon: Lucilla Lame, MD;  Location: [facility];  Service: Endoscopy;  Laterality: N/A;  sleep apnea  . HERNIA REPAIR  1970   herniorrhapy  . POLYPECTOMY  07/06/2016   Procedure: POLYPECTOMY;  Surgeon: Lucilla Lame, MD;  Location: [facility];  Service: Endoscopy;;   Family History  Problem Relation Age of Onset  . Macular degeneration Mother   . Memory loss Mother   . Heart attack Father   . Hypertension Father   . Obesity Father   . Diabetes Other   . Cancer Neg Hx   ' Social History   Socioeconomic History  . Marital status: Married    Spouse name: Not on file  . Number of children: 2  . Years of education: Not on file  . Highest education level: Bachelor's degree  (e.g., BA, AB, BS)  Occupational History  . Occupation: Journalist, newspaper    Comment: Retired 2019  Tobacco Use  . Smoking status: Former Research scientist (life sciences)  . Smokeless tobacco: Never Used  . Tobacco comment: 1 year in college  Substance and Sexual Activity  . Alcohol use: Yes    Alcohol/week: 0.0 standard drinks    Comment: occasional use, 1-2 drink/mo  . Drug use: No  . Sexual activity: Not on file  Other Topics Concern  . Not on file  Social History Narrative  . Not on file   Social Determinants of Health   Financial Resource Strain:   . Difficulty of Paying Living Expenses:   Food Insecurity:   . Worried About Charity fundraiser in the Last Year:   . Arboriculturist in the Last Year:   Transportation Needs:   . Film/video editor (Medical):   Marland Kitchen Lack of Transportation (Non-Medical):   Physical Activity: Insufficiently Active  . Days of Exercise per Week: 4 days  . Minutes of Exercise per Session: 30 min  Stress:   . Feeling of Stress :   Social Connections: Unknown  . Frequency of Communication with Friends and Family: Patient refused  . Frequency of Social Gatherings with Friends and Family: Patient refused  . Attends Religious Services: Patient  refused  . Active Member of Clubs or Organizations: Patient refused  . Attends Archivist Meetings: Patient refused  . Marital Status: Patient refused  Intimate Partner Violence: Unknown  . Fear of Current or Ex-Partner: Patient refused  . Emotionally Abused: Patient refused  . Physically Abused: Patient refused  . Sexually Abused: Patient refused    Outpatient Encounter Medications as of 04/17/2019  Medication Sig Note  . aspirin 81 MG tablet Take 1 tablet by mouth daily. 02/18/2015: Received from: [place]:   . atorvastatin (LIPITOR) 20 MG tablet Take 1 tablet by mouth once daily   . Cholecalciferol (VITAMIN D3) 5000 units CAPS Take 1 capsule by mouth daily. 02/18/2015: Received  from: [place]: Take by mouth.  Noelle Penner ALLERGY RELIEF, CETIRIZINE, 10 MG tablet Take 1 tablet by mouth once daily   . Multiple Vitamins-Minerals (MULTIVITAMIN ADULT PO) Take 1 tablet by mouth daily. 02/18/2015: Received from: [place]:   . NON FORMULARY CBD oil daily   . Omega-3 Fatty Acids (FISH OIL) 1200 MG CAPS Take 1 capsule by mouth daily. 02/18/2015: Received from: [place]: Take by mouth.  Marland Kitchen PARoxetine (PAXIL) 40 MG tablet Take 1 tablet (40 mg total) by mouth every other day.   . tadalafil (CIALIS) 5 MG tablet TAKE 1 TABLET BY MOUTH EVERY OTHER DAY AS NEEDED FOR ERECTILE DYSFUNCTION   . traZODone (DESYREL) 150 MG tablet TAKE 1/2 TO 1 (ONE-HALF TO ONE) TABLET BY MOUTH AT BEDTIME    No facility-administered encounter medications on file as of 04/17/2019.   No Known Allergies  Review of Systems  Constitutional: Negative for diaphoresis, fever and malaise/fatigue.  HENT: Positive for hearing loss and tinnitus. Negative for congestion, ear discharge, ear pain, sinus pain and sore throat.   Respiratory: Negative for cough and shortness of breath.   Cardiovascular: Negative for chest pain.    Objective:  BP 128/82 (BP Location: Right Arm, Patient Position: Sitting, Cuff Size: Normal)   Pulse (!) 56   Temp (!) 96.9 F (36.1 C) (Skin)   Wt 236 lb (107 kg)   SpO2 97%   BMI 33.86 kg/m   Physical Exam  Constitutional: He is oriented to person, place, and time and well-developed, well-nourished, and in no distress.  HENT:  Head: Normocephalic.  Both ear canals occluded with cerumen and diminished hearing.  Eyes: Conjunctivae are normal.  Cardiovascular: Normal rate.  Pulmonary/Chest: Effort normal and breath sounds normal.  Abdominal: Soft. Bowel sounds are normal.  Musculoskeletal:        General: Normal range of motion.     Cervical back: Neck supple.  Neurological: He is alert and  oriented to person, place, and time.  Skin: No rash noted.  Psychiatric: Mood, affect and judgment normal.    Assessment and Plan :   1. Bilateral impacted cerumen Diminished hearing with both ear canals occluded with wax. Large growth of hair on the tragus posteriorly. Irrigated large amount of cerumen from both canals and hearing restored. Both plugs had a great deal of hair incorporated in the wax. Recommend he have barber trim hair in ears each time he gets a haircut and use a wax removal kit to flush out as needed. Recheck prn.

## 2019-05-28 ENCOUNTER — Other Ambulatory Visit: Payer: Self-pay | Admitting: Family Medicine

## 2019-05-28 NOTE — Telephone Encounter (Signed)
Requested Prescriptions  Pending Prescriptions Disp Refills  . EQ ALLERGY RELIEF, CETIRIZINE, 10 MG tablet [Pharmacy Med Name: EQ Allergy Relief (Cetirizine) 10 MG Oral Tablet] 90 tablet 0    Sig: Take 1 tablet by mouth once daily     Ear, Nose, and Throat:  Antihistamines Passed - 05/28/2019 11:34 AM      Passed - Valid encounter within last 12 months    Recent Outpatient Visits          1 month ago Bilateral impacted cerumen   [place], [place]   5 months ago [place], Donald E, MD   9 months ago Erectile dysfunction, unspecified erectile dysfunction type   [facility] Birdie Sons, MD   1 year ago OSA on CPAP   [facility] Birdie Sons, MD   1 year ago Insect bite of abdomen, initial encounter   [facility] [place], [place]

## 2019-06-11 ENCOUNTER — Other Ambulatory Visit: Payer: Self-pay | Admitting: Family Medicine

## 2019-06-11 NOTE — Telephone Encounter (Signed)
Requested Prescriptions  Pending Prescriptions Disp Refills  . atorvastatin (LIPITOR) 20 MG tablet [Pharmacy Med Name: Atorvastatin Calcium 20 MG Oral Tablet] 90 tablet 3    Sig: Take 1 tablet by mouth once daily     Cardiovascular:  Antilipid - Statins Failed - 06/11/2019 10:31 AM      Failed - LDL in normal range and within 360 days    LDL Chol Calc (NIH)  Date Value Ref Range Status  12/28/2018 49 0 - 99 mg/dL Final         Failed - HDL in normal range and within 360 days    HDL  Date Value Ref Range Status  12/28/2018 33 (L) >39 mg/dL Final         Failed - Triglycerides in normal range and within 360 days    Triglycerides  Date Value Ref Range Status  12/28/2018 207 (H) 0 - 149 mg/dL Final         Passed - Total Cholesterol in normal range and within 360 days    Cholesterol, Total  Date Value Ref Range Status  12/28/2018 116 100 - 199 mg/dL Final         Passed - Patient is not pregnant      Passed - Valid encounter within last 12 months    Recent Outpatient Visits          1 month ago Bilateral impacted cerumen   [place], Vickki Muff, [place]   5 months ago [place], Donald E, MD   10 months ago Erectile dysfunction, unspecified erectile dysfunction type   [facility] Birdie Sons, MD   1 year ago OSA on CPAP   [facility] Birdie Sons, MD   1 year ago Insect bite of abdomen, initial encounter   [facility] [place], [place]

## 2019-07-23 ENCOUNTER — Other Ambulatory Visit: Payer: Self-pay | Admitting: Family Medicine

## 2019-07-23 DIAGNOSIS — F419 Anxiety disorder, unspecified: Secondary | ICD-10-CM

## 2019-07-23 NOTE — Telephone Encounter (Signed)
Requested medication (s) are due for refill today: yes  Requested medication (s) are on the active medication list: yes  Last refill:  08/02/18 # 3 tabs  Future visit scheduled: no  Notes to clinic:  Per office notes, pt was going to wean off-   Requested Prescriptions  Pending Prescriptions Disp Refills   PARoxetine (PAXIL) 40 MG tablet [Pharmacy Med Name: PARoxetine HCl 40 MG Oral Tablet] 90 tablet 0    Sig: Take 1 tablet by mouth once daily      Psychiatry:  Antidepressants - SSRI Passed - 07/23/2019  1:50 PM      Passed - Valid encounter within last 6 months    Recent Outpatient Visits           3 months ago Bilateral impacted cerumen   [place], Vickki Muff, [place]   6 months ago [place], Donald E, MD   11 months ago Erectile dysfunction, unspecified erectile dysfunction type   [facility] Birdie Sons, MD   1 year ago OSA on CPAP   [facility] Birdie Sons, MD   2 years ago Insect bite of abdomen, initial encounter   [facility] [place], [place]

## 2019-08-12 ENCOUNTER — Other Ambulatory Visit: Payer: Self-pay | Admitting: Family Medicine

## 2019-08-12 NOTE — Telephone Encounter (Signed)
Requested Prescriptions  Pending Prescriptions Disp Refills   traZODone (DESYREL) 150 MG tablet [Pharmacy Med Name: traZODone HCl 150 MG Oral Tablet] 90 tablet 0    Sig: TAKE 1/2 TO 1 (ONE-HALF TO ONE) TABLET BY MOUTH AT BEDTIME     Psychiatry: Antidepressants - Serotonin Modulator Passed - 08/12/2019 11:53 AM      Passed - Valid encounter within last 6 months    Recent Outpatient Visits          3 months ago Bilateral impacted cerumen   [place], [place]   7 months ago [place], Donald E, MD   1 year ago Erectile dysfunction, unspecified erectile dysfunction type   [facility] Birdie Sons, MD   1 year ago OSA on CPAP   [facility] Birdie Sons, MD   2 years ago Insect bite of abdomen, initial encounter   [facility] [place], [place]

## 2019-08-27 ENCOUNTER — Other Ambulatory Visit: Payer: Self-pay | Admitting: Family Medicine

## 2019-08-27 NOTE — Telephone Encounter (Signed)
Requested Prescriptions  Pending Prescriptions Disp Refills   EQ ALLERGY RELIEF, CETIRIZINE, 10 MG tablet [Pharmacy Med Name: EQ Allergy Relief (Cetirizine) 10 MG Oral Tablet] 90 tablet 0    Sig: Take 1 tablet by mouth once daily     Ear, Nose, and Throat:  Antihistamines Passed - 08/27/2019 12:06 PM      Passed - Valid encounter within last 12 months    Recent Outpatient Visits          4 months ago Bilateral impacted cerumen   [place], Vickki Muff, [place]   8 months ago [place], Donald E, MD   1 year ago Erectile dysfunction, unspecified erectile dysfunction type   [facility] Birdie Sons, MD   1 year ago OSA on CPAP   [facility] Birdie Sons, MD   2 years ago Insect bite of abdomen, initial encounter   [facility] [place], [place]

## 2019-09-29 ENCOUNTER — Telehealth: Payer: Self-pay | Admitting: Family Medicine

## 2019-09-29 NOTE — Telephone Encounter (Signed)
Patient is calling regarding replacement of straps for head gear for CPAP machine. Patient was advised from [facility] that he needs an order or a script. Please advise CB- [phone]

## 2019-10-02 NOTE — Telephone Encounter (Signed)
Order written

## 2019-10-03 DIAGNOSIS — Z23 Encounter for immunization: Secondary | ICD-10-CM | POA: Diagnosis not present

## 2019-10-06 ENCOUNTER — Telehealth: Payer: Self-pay

## 2019-10-06 NOTE — Telephone Encounter (Signed)
Copied from [place] [id number]. Topic: General - Inquiry >> Oct 06, 2019 12:38 PM Gillis Ends D wrote: Reason for CRM: patient would like the written orders for [facility] for his strap to his Cpap machine faxed to [phone]. They are waiting on the fax to come through. Please advise

## 2019-10-06 NOTE — Telephone Encounter (Signed)
Order faxed.

## 2019-11-25 ENCOUNTER — Other Ambulatory Visit: Payer: Self-pay | Admitting: Family Medicine

## 2019-11-25 NOTE — Telephone Encounter (Signed)
Requested Prescriptions  Pending Prescriptions Disp Refills   EQ ALLERGY RELIEF, CETIRIZINE, 10 MG tablet [Pharmacy Med Name: EQ Allergy Relief (Cetirizine) 10 MG Oral Tablet] 90 tablet 0    Sig: Take 1 tablet by mouth once daily     Ear, Nose, and Throat:  Antihistamines Passed - 11/25/2019 11:57 AM      Passed - Valid encounter within last 12 months    Recent Outpatient Visits          7 months ago Bilateral impacted cerumen   [place], [place]   11 months ago [place], Donald E, MD   1 year ago Erectile dysfunction, unspecified erectile dysfunction type   [facility] Birdie Sons, MD   2 years ago OSA on CPAP   [facility] Birdie Sons, MD   2 years ago Insect bite of abdomen, initial encounter   [facility] [place], [place]

## 2020-01-05 ENCOUNTER — Other Ambulatory Visit: Payer: Self-pay | Admitting: Family Medicine

## 2020-01-05 DIAGNOSIS — N529 Male erectile dysfunction, unspecified: Secondary | ICD-10-CM

## 2020-01-05 DIAGNOSIS — D2261 Melanocytic nevi of right upper limb, including shoulder: Secondary | ICD-10-CM | POA: Diagnosis not present

## 2020-01-05 DIAGNOSIS — D2272 Melanocytic nevi of left lower limb, including hip: Secondary | ICD-10-CM | POA: Diagnosis not present

## 2020-01-05 DIAGNOSIS — L821 Other seborrheic keratosis: Secondary | ICD-10-CM | POA: Diagnosis not present

## 2020-01-05 DIAGNOSIS — D225 Melanocytic nevi of trunk: Secondary | ICD-10-CM | POA: Diagnosis not present

## 2020-01-05 DIAGNOSIS — L400 Psoriasis vulgaris: Secondary | ICD-10-CM | POA: Diagnosis not present

## 2020-01-05 NOTE — Telephone Encounter (Signed)
Requested medication (s) are due for refill today:   Yes  Requested medication (s) are on the active medication list:   Yes  Future visit scheduled:   No   Last ordered: 11/28/2018 #30, 12 refills  Returned for Dr. Caryn Section to determine if he wants to refill this or not.  It passes protocol but it's been a year since pt seen for this issue.   Requested Prescriptions  Pending Prescriptions Disp Refills   tadalafil (CIALIS) 5 MG tablet [Pharmacy Med Name: Tadalafil 5 MG Oral Tablet] 30 tablet 0    Sig: TAKE 1 TABLET BY MOUTH EVERY OTHER DAY AS NEEDED FOR ERECTILE DYSFUNCTION      Urology: Erectile Dysfunction Agents Passed - 01/05/2020  9:12 AM      Passed - Last BP in normal range    BP Readings from Last 1 Encounters:  04/17/19 128/82          Passed - Valid encounter within last 12 months    Recent Outpatient Visits           8 months ago Bilateral impacted cerumen   [place], PA-C   1 year ago Pure hyperglyceridemia   [facility] Birdie Sons, MD   1 year ago Erectile dysfunction, unspecified erectile dysfunction type   [facility] Birdie Sons, MD   2 years ago OSA on CPAP   [facility] Birdie Sons, [facility]   2 years ago Insect bite of abdomen, initial encounter   [facility] [place], [place]

## 2020-01-23 ENCOUNTER — Other Ambulatory Visit: Payer: Self-pay | Admitting: Family Medicine

## 2020-01-23 DIAGNOSIS — F419 Anxiety disorder, unspecified: Secondary | ICD-10-CM

## 2020-01-29 NOTE — Progress Notes (Deleted)
Subjective:   Earl Crawford is a 70 y.o. male who presents for Medicare Annual/Subsequent preventive examination.  Review of Systems    N/A        Objective:    There were no vitals filed for this visit. There is no height or weight on file to calculate BMI.  Advanced Directives 12/28/2018 10/25/2017 07/06/2016 05/13/2015  Does Patient Have a Medical Advance Directive? Yes Yes Yes Yes  Type of Paramedic of [place];Living will [place];Living will [place];Living will [place];Living will  Does patient want to make changes to medical advance directive? - - No - Patient declined No - Patient declined  Copy of [place] in Chart? No - copy requested No - copy requested No - copy requested -    Current Medications (verified) Outpatient Encounter Medications as of 01/30/2020  Medication Sig  . aspirin 81 MG tablet Take 1 tablet by mouth daily.  Marland Kitchen atorvastatin (LIPITOR) 20 MG tablet Take 1 tablet by mouth once daily  . Cholecalciferol (VITAMIN D3) 5000 units CAPS Take 1 capsule by mouth daily.  Noelle Penner ALLERGY RELIEF, CETIRIZINE, 10 MG tablet Take 1 tablet by mouth once daily  . Multiple Vitamins-Minerals (MULTIVITAMIN ADULT PO) Take 1 tablet by mouth daily.  . NON FORMULARY CBD oil daily  . Omega-3 Fatty Acids (FISH OIL) 1200 MG CAPS Take 1 capsule by mouth daily.  Marland Kitchen PARoxetine (PAXIL) 40 MG tablet Take 1 tablet by mouth once daily  . tadalafil (CIALIS) 5 MG tablet TAKE 1 TABLET BY MOUTH EVERY OTHER DAY AS NEEDED FOR ERECTILE DYSFUNCTION  . traZODone (DESYREL) 150 MG tablet TAKE 1/2 TO 1 (ONE-HALF TO ONE) TABLET BY MOUTH AT BEDTIME   No facility-administered encounter medications on file as of 01/30/2020.    Allergies (verified) Patient has no known allergies.   History: Past Medical History:  Diagnosis Date  . Asthma    as child  . Benign neoplasm of transverse colon   .  Bilateral impacted cerumen 11/02/2016  . History of measles   . History of mumps   . Sleep apnea    not currently using CPAP "too noisy"  . Tinnitus    Past Surgical History:  Procedure Laterality Date  . COLONOSCOPY    . COLONOSCOPY WITH PROPOFOL N/A 07/06/2016   Procedure: COLONOSCOPY WITH PROPOFOL;  Surgeon: Lucilla Lame, MD;  Location: [place];  Service: Endoscopy;  Laterality: N/A;  sleep apnea  . HERNIA REPAIR  1970   herniorrhapy  . POLYPECTOMY  07/06/2016   Procedure: POLYPECTOMY;  Surgeon: Lucilla Lame, MD;  Location: [place];  Service: Endoscopy;;   Family History  Problem Relation Age of Onset  . Macular degeneration Mother   . Memory loss Mother   . Heart attack Father   . Hypertension Father   . Obesity Father   . Diabetes Other   . Cancer Neg Hx    Social History   Socioeconomic History  . Marital status: Married    Spouse name: Not on file  . Number of children: 2  . Years of education: Not on file  . Highest education level: Bachelor's degree (e.g., BA, AB, BS)  Occupational History  . Occupation: Journalist, newspaper    Comment: Retired 2019  Tobacco Use  . Smoking status: Former Research scientist (life sciences)  . Smokeless tobacco: Never Used  . Tobacco comment: 1 year in college  Vaping Use  .  Vaping Use: Never used  Substance and Sexual Activity  . Alcohol use: Yes    Alcohol/week: 0.0 standard drinks    Comment: occasional use, 1-2 drink/mo  . Drug use: No  . Sexual activity: Not on file  Other Topics Concern  . Not on file  Social History Narrative  . Not on file   Social Determinants of Health   Financial Resource Strain: Not on file  Food Insecurity: Not on file  Transportation Needs: Not on file  Physical Activity: Not on file  Stress: Not on file  Social Connections: Not on file    Tobacco Counseling Counseling given: Not Answered Comment: 1 year in college   Clinical Intake:                 Diabetic? No          Activities of Daily Living No flowsheet data found.  Patient Care Team: Birdie Sons, MD as PCP - General (Family Medicine) Lucilla Lame, MD as Consulting Physician (Gastroenterology) [place], [place] any recent Medical Services you may have received from other than [facility] providers in the past year (date may be approximate).     Assessment:   This is a routine wellness examination for [facility].  Hearing/Vision screen No exam data present  Dietary issues and exercise activities discussed:    Goals    . DIET - REDUCE SUGAR INTAKE     Recommend to decrease ice cream intake to a couple times weekly and to get low or no sugar types.       Depression Screen PHQ 2/9 Scores 12/28/2018 10/25/2017 05/18/2016 05/13/2015  PHQ - 2 Score 0 0 0 0  PHQ- 9 Score - - 0 1    Fall Risk Fall Risk  12/28/2018 12/14/2018 12/08/2017 10/25/2017 05/18/2016  Falls in the past year? 0 0 1 No No  Comment - Emmi Telephone Survey: data to providers prior to load Emmi Telephone Survey: data to providers prior to load - -  Number falls in past yr: 0 - 1 - -  Comment - - Emmi Telephone Survey Actual Response = 1 - -  Injury with Fall? 0 - 1 - -    FALL RISK PREVENTION PERTAINING TO THE HOME:  Any stairs in or around the home? {YES/NO:21197} If so, are there any without handrails? {YES/NO:21197} Home free of loose throw rugs in walkways, pet beds, electrical cords, etc? Yes  Adequate lighting in your home to reduce risk of falls? Yes   ASSISTIVE DEVICES UTILIZED TO PREVENT FALLS:  Life alert? {YES/NO:21197} Use of a cane, walker or w/c? {YES/NO:21197} Grab bars in the bathroom? {YES/NO:21197} Shower chair or bench in shower? {YES/NO:21197} Elevated toilet seat or a handicapped toilet? {YES/NO:21197}  TIMED UP AND GO:  Was the test performed? Yes .  Length of time to ambulate 10 feet: *** sec.   {Appearance of WNIO:2703500}  Cognitive Function:     6CIT Screen  10/25/2017  What Year? 0 points  What month? 0 points  What time? 0 points  Count back from 20 0 points  Months in reverse 0 points  Repeat phrase 0 points  Total Score 0    Immunizations Immunization History  Administered Date(s) Administered  . H1N1 11/24/2007  . Influenza Split 12/04/2006, 02/08/2009, 12/16/2009, 11/19/2010  . Influenza, High Dose Seasonal PF 11/02/2016, 10/25/2017  . Influenza-Unspecified 11/17/2014  . Moderna Sars-Covid-2 Vaccination 02/26/2019, 03/29/2019  . Pneumococcal Conjugate-13 02/19/2015  . Pneumococcal  Polysaccharide-23 05/18/2016  . Td 01/23/2000  . Tdap 05/04/2008  . Zoster 02/27/2010    TDAP status: Due, Education has been provided regarding the importance of this vaccine. Advised may receive this vaccine at local pharmacy or Health Dept. Aware to provide a copy of the vaccination record if obtained from local pharmacy or Health Dept. Verbalized acceptance and understanding.  {Flu Vaccine status:2101806}  Pneumococcal vaccine status: Up to date  Covid-19 vaccine status: Completed vaccines  Qualifies for Shingles Vaccine? Yes   Zostavax completed Yes   Shingrix Completed?: No.    Education has been provided regarding the importance of this vaccine. Patient has been advised to call insurance company to determine out of pocket expense if they have not yet received this vaccine. Advised may also receive vaccine at local pharmacy or Health Dept. Verbalized acceptance and understanding.  Screening Tests Health Maintenance  Topic Date Due  . TETANUS/TDAP  05/05/2018  . INFLUENZA VACCINE  08/20/2019  . COVID-19 Vaccine (3 - Booster for Moderna series) 09/29/2019  . COLONOSCOPY (Pts 45-33yrs Insurance coverage will need to be confirmed)  07/06/2021  . Hepatitis C Screening  Completed  . PNA vac Low Risk Adult  Completed    Health Maintenance  Health Maintenance Due  Topic Date Due  . TETANUS/TDAP  05/05/2018  . INFLUENZA VACCINE  08/20/2019   . COVID-19 Vaccine (3 - Booster for Moderna series) 09/29/2019    Colorectal cancer screening: Type of screening: Colonoscopy. Completed 07/06/16. Repeat every 5 years  Lung Cancer Screening: (Low Dose CT Chest recommended if Age 66-80 years, 30 pack-year currently smoking OR have quit w/in 15years.) {DOES NOT does:27190::"does not"} qualify.   Lung Cancer Screening Referral: ***  Additional Screening:  Hepatitis C Screening: Up to date  Vision Screening: Recommended annual ophthalmology exams for early detection of glaucoma and other disorders of the eye. Is the patient up to date with their annual eye exam?  Yes  Who is the provider or what is the name of the office in which the patient attends annual eye exams? *** If pt is not established with a provider, would they like to be referred to a provider to establish care? No .   Dental Screening: Recommended annual dental exams for proper oral hygiene  Community Resource Referral / Chronic Care Management: CRR required this visit?  No   CCM required this visit?  No      Plan:     I have personally reviewed and noted the following in the patient's chart:   . Medical and social history . Use of alcohol, tobacco or illicit drugs  . Current medications and supplements . Functional ability and status . Nutritional status . Physical activity . Advanced directives . List of other physicians . Hospitalizations, surgeries, and ER visits in previous 12 months . Vitals . Screenings to include cognitive, depression, and falls . Referrals and appointments  In addition, I have reviewed and discussed with patient certain preventive protocols, quality metrics, and best practice recommendations. A written personalized care plan for preventive services as well as general preventive health recommendations were provided to patient.     Maddalynn Barnard [place], [place]   [id number]   Nurse Notes: ***

## 2020-01-30 ENCOUNTER — Telehealth (INDEPENDENT_AMBULATORY_CARE_PROVIDER_SITE_OTHER): Payer: Medicare Other | Admitting: Family Medicine

## 2020-01-30 ENCOUNTER — Other Ambulatory Visit: Payer: Self-pay

## 2020-01-30 DIAGNOSIS — M25512 Pain in left shoulder: Secondary | ICD-10-CM

## 2020-01-30 DIAGNOSIS — M6208 Separation of muscle (nontraumatic), other site: Secondary | ICD-10-CM | POA: Insufficient documentation

## 2020-01-30 DIAGNOSIS — J069 Acute upper respiratory infection, unspecified: Secondary | ICD-10-CM

## 2020-01-30 DIAGNOSIS — G8929 Other chronic pain: Secondary | ICD-10-CM

## 2020-01-30 NOTE — Progress Notes (Signed)
      Established patient visit   Patient: Earl Crawford   DOB: May 10, 1950   70 y.o. Male  MRN: [id number] Visit Date: 01/30/2020  Today's healthcare provider: Lelon Huh, MD   No chief complaint on file.  Subjective    HPI  Has had a cough and mucous production about 4-5 days ago and had negative Covid test, Now having congestion mainly in sinuses, but cough is improving. Having a lot of nasal drainage in the last 2 days  No fevers, chills or sweats. No dyspnea, no headaches  Also having pain shooting from left shoulder to wrist for several months. Only lasts a few seconds. No specific triggers. Occurs sometimes when sitting.   He also reports a large vertical bulge in his abdomen that only appears when he sitting up from a lying position. It is not painful at all. Goes away once he is sitting up. Does not appear when standing.      Medications: Outpatient Medications Prior to Visit  Medication Sig  . aspirin 81 MG tablet Take 1 tablet by mouth daily.  Marland Kitchen atorvastatin (LIPITOR) 20 MG tablet Take 1 tablet by mouth once daily  . Cholecalciferol (VITAMIN D3) 5000 units CAPS Take 1 capsule by mouth daily.  Noelle Penner ALLERGY RELIEF, CETIRIZINE, 10 MG tablet Take 1 tablet by mouth once daily  . Multiple Vitamins-Minerals (MULTIVITAMIN ADULT PO) Take 1 tablet by mouth daily.  . NON FORMULARY CBD oil daily  . Omega-3 Fatty Acids (FISH OIL) 1200 MG CAPS Take 1 capsule by mouth daily.  Marland Kitchen PARoxetine (PAXIL) 40 MG tablet Take 1 tablet by mouth once daily  . tadalafil (CIALIS) 5 MG tablet TAKE 1 TABLET BY MOUTH EVERY OTHER DAY AS NEEDED FOR ERECTILE DYSFUNCTION  . traZODone (DESYREL) 150 MG tablet TAKE 1/2 TO 1 (ONE-HALF TO ONE) TABLET BY MOUTH AT BEDTIME   No facility-administered medications prior to visit.       Objective    There were no vitals taken for this visit.   Physical Exam   Awake, alert, oriented x 3. In no apparent distress   No results found for any visits on  01/30/20.  Assessment & Plan     1. Viral upper respiratory tract infection C/w common cold. Is fully vaccinated against Covid. Should take typical Covid isolation precautions. Call if any sx get worse instead of better. Consider antibiotic if he develops sx of bacterial sinus or bronchial infection.   2. Chronic left shoulder pain  - DG Shoulder Left; Future  3. Diastasis recti Reassured benign nature of condition. Could refer to surgery for evaluation if he likes, but would probably recommend against surgery if not causing any discomfort.            Lelon Huh, MD  [facility] [phone] (phone) [phone] (fax)  [place]

## 2020-01-31 DIAGNOSIS — Z20822 Contact with and (suspected) exposure to covid-19: Secondary | ICD-10-CM | POA: Diagnosis not present

## 2020-01-31 DIAGNOSIS — Z03818 Encounter for observation for suspected exposure to other biological agents ruled out: Secondary | ICD-10-CM | POA: Diagnosis not present

## 2020-02-06 ENCOUNTER — Ambulatory Visit: Payer: Self-pay | Admitting: *Deleted

## 2020-02-06 ENCOUNTER — Encounter: Payer: Self-pay | Admitting: Family Medicine

## 2020-02-06 ENCOUNTER — Other Ambulatory Visit: Payer: Self-pay | Admitting: Family Medicine

## 2020-02-06 NOTE — Telephone Encounter (Signed)
Copied from [place] [id number]. Topic: General - Other >> Feb 06, 2020  9:32 AM Yvette Rack wrote: Reason for CRM: Pt stated the cough has not gotten any better and he was advised that if it did not get better to call the office and a Rx can be se sent to his pharmacy. Pt requests that the Rx be sent to [place], [place]

## 2020-02-06 NOTE — Telephone Encounter (Signed)
Patient called a 2nd time for prescription for cough suppressant.. Patient reports he has coughing episodes and his ribs are becoming significantly sore and OTC cough medications are not providing relief. Patient requesting [facility] to be notified ASAP at [phone],  if  PCP writes Rx as discussed in last virtual visit.  Reviewed care advise to try hard candy , cough drops, fluids , and warm tea and honey. Patient reports these interventions have not worked. Please advise.

## 2020-02-06 NOTE — Telephone Encounter (Signed)
Requested Prescriptions  Pending Prescriptions Disp Refills  . traZODone (DESYREL) 150 MG tablet [Pharmacy Med Name: traZODone HCl 150 MG Oral Tablet] 90 tablet 0    Sig: TAKE 1/2 TO 1 (ONE-HALF TO ONE) TABLET BY MOUTH AT BEDTIME     Psychiatry: Antidepressants - Serotonin Modulator Passed - 02/06/2020 10:36 AM      Passed - Valid encounter within last 6 months    Recent Outpatient Visits          1 week ago Viral upper respiratory tract infection   [facility] Birdie Sons, MD   9 months ago Bilateral impacted cerumen   [place], Vickki Muff, PA-C   1 year ago [place], Donald E, MD   1 year ago Erectile dysfunction, unspecified erectile dysfunction type   [facility] Birdie Sons, MD   2 years ago OSA on CPAP   [facility] Birdie Sons, MD

## 2020-02-07 ENCOUNTER — Telehealth: Payer: Self-pay

## 2020-02-07 MED ORDER — HYDROCODONE-HOMATROPINE 5-1.5 MG/5ML PO SYRP
5.0000 mL | ORAL_SOLUTION | Freq: Three times a day (TID) | ORAL | 0 refills | Status: DC | PRN
Start: 2020-02-07 — End: 2020-04-01

## 2020-02-07 MED ORDER — HYDROCODONE-HOMATROPINE 5-1.5 MG/5ML PO SYRP: 5.0000 mL | ORAL_SOLUTION | Freq: Three times a day (TID) | ORAL | 0 refills | Status: DC | PRN

## 2020-02-07 NOTE — Telephone Encounter (Signed)
Please review. Thanks!  

## 2020-02-07 NOTE — Addendum Note (Signed)
Addended by: Birdie Sons on: 02/07/2020 09:47 AM   Modules accepted: Orders

## 2020-02-07 NOTE — Telephone Encounter (Signed)
Copied from [place] [id number]. Topic: General - Inquiry >> Feb 06, 2020  5:43 PM Earl Crawford wrote: Reason for CRM: Patient was told by the provider that if his cough didn't get any better to call back and he would get him s cough medication. He called today this morning and also sent a mychart message. He would like a call back as soon as possible. He can be reached at [phone]. Please advise

## 2020-02-07 NOTE — Telephone Encounter (Signed)
Another message has been sent to the provider about this issue.

## 2020-02-07 NOTE — Telephone Encounter (Signed)
Prescription sent to pharmacy.

## 2020-02-13 ENCOUNTER — Ambulatory Visit
Admission: RE | Admit: 2020-02-13 | Discharge: 2020-02-13 | Disposition: A | Discharge status: Home / Self Care | Payer: Medicare Other | Source: Ambulatory Visit | Attending: Family Medicine | Admitting: Family Medicine

## 2020-02-13 ENCOUNTER — Other Ambulatory Visit: Payer: Self-pay

## 2020-02-13 DIAGNOSIS — M25512 Pain in left shoulder: Secondary | ICD-10-CM | POA: Diagnosis not present

## 2020-02-13 DIAGNOSIS — G8929 Other chronic pain: Secondary | ICD-10-CM | POA: Insufficient documentation

## 2020-02-14 NOTE — Telephone Encounter (Signed)
-----   Message from Birdie Sons, MD sent at 02/14/2020  7:45 AM EST ----- Xray of shoulder is normal. Suspect he has a pinched nerve or muscle strain in his shoulder. Recommend referral to orthopedics If he is still having the pain.

## 2020-02-14 NOTE — Telephone Encounter (Signed)
-----   Message from Donald E Fisher, MD sent at 02/14/2020  7:45 AM EST ----- Xray of shoulder is normal. Suspect he has a pinched nerve or muscle strain in his shoulder. Recommend referral to orthopedics If he is still having the pain.  

## 2020-02-21 ENCOUNTER — Other Ambulatory Visit: Payer: Self-pay | Admitting: Family Medicine

## 2020-03-05 NOTE — Progress Notes (Signed)
Subjective:   Earl Crawford is a 70 y.o. male who presents for Medicare Annual/Subsequent preventive examination.  Review of Systems    N/A  Cardiac Risk Factors include: advanced age (>32men, >16 women);male gender;dyslipidemia;obesity (BMI >30kg/m2)     Objective:    Today's Vitals   03/06/20 1027  BP: (!) 112/58  Pulse: 63  Temp: 97.7 F (36.5 C)  TempSrc: Oral  SpO2: 98%  Weight: 235 lb 9.6 oz (106.9 kg)  Height: 5\' 10"  (1.778 m)  PainSc: 0-No pain   Body mass index is 33.81 kg/m.  Advanced Directives 03/06/2020 12/28/2018 10/25/2017 07/06/2016 05/13/2015  Does Patient Have a Medical Advance Directive? Yes Yes Yes Yes Yes  Type of Paramedic of [place];Living will [place];Living will [place];Living will [place];Living will [place];Living will  Does patient want to make changes to medical advance directive? - - - No - Patient declined No - Patient declined  Copy of [place] in Chart? No - copy requested No - copy requested No - copy requested No - copy requested -    Current Medications (verified) Outpatient Encounter Medications as of 03/06/2020  Medication Sig  . aspirin 81 MG tablet Take 1 tablet by mouth daily.  Marland Kitchen atorvastatin (LIPITOR) 20 MG tablet Take 1 tablet by mouth once daily  . Cholecalciferol (VITAMIN D3) 5000 units CAPS Take 1 capsule by mouth daily.  Noelle Penner ALLERGY RELIEF, CETIRIZINE, 10 MG tablet Take 1 tablet by mouth once daily  . HYDROcodone-homatropine (HYCODAN) 5-1.5 MG/5ML syrup Take 5 mLs by mouth every 8 (eight) hours as needed for cough.  . Multiple Vitamins-Minerals (MULTIVITAMIN ADULT PO) Take 1 tablet by mouth daily.  . Omega-3 Fatty Acids (FISH OIL) 1200 MG CAPS Take 1 capsule by mouth daily.  Marland Kitchen PARoxetine (PAXIL) 40 MG tablet Take 1 tablet by mouth once daily  . tadalafil (CIALIS) 5 MG tablet TAKE 1 TABLET BY  MOUTH EVERY OTHER DAY AS NEEDED FOR ERECTILE DYSFUNCTION  . traZODone (DESYREL) 150 MG tablet TAKE 1/2 TO 1 (ONE-HALF TO ONE) TABLET BY MOUTH AT BEDTIME  . NON FORMULARY CBD oil daily (Patient not taking: Reported on 03/06/2020)   No facility-administered encounter medications on file as of 03/06/2020.    Allergies (verified) Patient has no known allergies.   History: Past Medical History:  Diagnosis Date  . Asthma    as child  . Benign neoplasm of transverse colon   . Bilateral impacted cerumen 11/02/2016  . Boutonniere deformity of finger   . History of measles   . History of mumps   . Hyperlipidemia   . Sleep apnea    not currently using CPAP "too noisy"  . Tinnitus    Past Surgical History:  Procedure Laterality Date  . COLONOSCOPY    . COLONOSCOPY WITH PROPOFOL N/A 07/06/2016   Procedure: COLONOSCOPY WITH PROPOFOL;  Surgeon: Lucilla Lame, MD;  Location: [facility];  Service: Endoscopy;  Laterality: N/A;  sleep apnea  . HERNIA REPAIR  1970   herniorrhapy  . POLYPECTOMY  07/06/2016   Procedure: POLYPECTOMY;  Surgeon: Lucilla Lame, MD;  Location: [place];  Service: Endoscopy;;   Family History  Problem Relation Age of Onset  . Macular degeneration Mother   . Memory loss Mother   . Heart attack Father   . Hypertension Father   . Obesity Father   . Heart disease Father   .  Diabetes Other   . Cancer Neg Hx    Social History   Socioeconomic History  . Marital status: Married    Spouse name: Not on file  . Number of children: 2  . Years of education: Not on file  . Highest education level: Bachelor's degree (e.g., BA, AB, BS)  Occupational History  . Occupation: Journalist, newspaper    Comment: Retired 2019  Tobacco Use  . Smoking status: Former Research scientist (life sciences)  . Smokeless tobacco: Never Used  . Tobacco comment: 1 year in college  Vaping Use  . Vaping Use: Never used  Substance and Sexual Activity  . Alcohol use: Yes    Alcohol/week: 0.0 - 2.0  standard drinks    Comment: 2-4 times a month  . Drug use: No  . Sexual activity: Not on file  Other Topics Concern  . Not on file  Social History Narrative  . Not on file   Social Determinants of Health   Financial Resource Strain: Low Risk   . Difficulty of Paying Living Expenses: Not hard at all  Food Insecurity: No Food Insecurity  . Worried About Charity fundraiser in the Last Year: Never true  . Ran Out of Food in the Last Year: Never true  Transportation Needs: No Transportation Needs  . Lack of Transportation (Medical): No  . Lack of Transportation (Non-Medical): No  Physical Activity: Insufficiently Active  . Days of Exercise per Week: 1 day  . Minutes of Exercise per Session: 30 min  Stress: No Stress Concern Present  . Feeling of Stress : Not at all  Social Connections: Socially Integrated  . Frequency of Communication with Friends and Family: More than three times a week  . Frequency of Social Gatherings with Friends and Family: Three times a week  . Attends Religious Services: More than 4 times per year  . Active Member of Clubs or Organizations: Yes  . Attends Archivist Meetings: More than 4 times per year  . Marital Status: Married    Tobacco Counseling Counseling given: Not Answered Comment: 1 year in college   Clinical Intake:  Pre-visit preparation completed: Yes  Pain : No/denies pain Pain Score: 0-No pain     Nutritional Status: BMI > 30  Obese Nutritional Risks: None Diabetes: No  How often do you need to have someone help you when you read instructions, pamphlets, or other written materials from your doctor or pharmacy?: 1 - Never  Diabetic? No  Interpreter Needed?: No  Information entered by :: [facility], LPN   Activities of Daily Living In your present state of health, do you have any difficulty performing the following activities: 03/06/2020  Hearing? 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  Vision? N  Difficulty concentrating or making decisions? N  Walking or climbing stairs? N  Dressing or bathing? N  Doing errands, shopping? N  Preparing Food and eating ? N  Using the Toilet? N  In the past six months, have you accidently leaked urine? N  Do you have problems with loss of bowel control? N  Managing your Medications? N  Managing your Finances? N  Housekeeping or managing your Housekeeping? N  Some recent data might be hidden    Patient Care Team: Birdie Sons, MD as PCP - General (Family Medicine) Lucilla Lame, MD as Consulting Physician (Gastroenterology) [place], [place] any recent Medical Services you may have received from other than [facility]  providers in the past year (date may be approximate).     Assessment:   This is a routine wellness examination for [facility].  Hearing/Vision screen No exam data present  Dietary issues and exercise activities discussed: Current Exercise Habits: Home exercise routine, Type of exercise: walking, Time (Minutes): 30, Frequency (Times/Week): 1, Weekly Exercise (Minutes/Week): 30, Intensity: Mild, Exercise limited by: None identified  Goals    . Exercise 3x per week (30 min per time)     Recommend to exercise for 3 days a week for at least 30 minutes at a time.       Depression Screen PHQ 2/9 Scores 03/06/2020 12/28/2018 10/25/2017 05/18/2016 05/13/2015  PHQ - 2 Score 0 0 0 0 0  PHQ- 9 Score - - - 0 1    Fall Risk Fall Risk  03/06/2020 12/28/2018 12/14/2018 12/08/2017 10/25/2017  Falls in the past year? 0 0 0 1 No  Comment - - [facility] Telephone Survey: data to providers prior to load [employer] Telephone Survey: data to providers prior to load -  Number falls in past yr: 0 0 - 1 -  Comment - - - Emmi Telephone Survey Actual Response = 1 -  Injury with Fall? 0 0 - 1 -    FALL RISK PREVENTION PERTAINING TO THE HOME:  Any stairs in or around the home? Yes  If so, are there any without handrails? No   Home free of loose throw rugs in walkways, pet beds, electrical cords, etc? Yes  Adequate lighting in your home to reduce risk of falls? Yes   ASSISTIVE DEVICES UTILIZED TO PREVENT FALLS:  Life alert? No  Use of a cane, walker or w/c? No  Grab bars in the bathroom? Yes  Shower chair or bench in shower? Yes  Elevated toilet seat or a handicapped toilet? No   TIMED UP AND GO:  Was the test performed? Yes .  Length of time to ambulate 10 feet: 8 sec.   Gait steady and fast without use of assistive device  Cognitive Function: Normal cognitive status assessed by direct observation by this Nurse Health Advisor. No abnormalities found.       6CIT Screen 10/25/2017  What Year? 0 points  What month? 0 points  What time? 0 points  Count back from 20 0 points  Months in reverse 0 points  Repeat phrase 0 points  Total Score 0    Immunizations Immunization History  Administered Date(s) Administered  . H1N1 11/24/2007  . Influenza Split 12/04/2006, 02/08/2009, 12/16/2009, 11/19/2010  . Influenza, High Dose Seasonal PF 11/02/2016, 10/25/2017  . Influenza-Unspecified 11/17/2014  . Moderna Sars-Covid-2 Vaccination 02/26/2019, 03/29/2019, 11/15/2019  . Pneumococcal Conjugate-13 02/19/2015  . Pneumococcal Polysaccharide-23 05/18/2016  . Td 01/23/2000  . Tdap 05/04/2008  . Zoster 02/27/2010    TDAP status: Due, Education has been provided regarding the importance of this vaccine. Advised may receive this vaccine at local pharmacy or Health Dept. Aware to provide a copy of the vaccination record if obtained from local pharmacy or Health Dept. Verbalized acceptance and understanding.  Flu Vaccine status: Up to date  Pneumococcal vaccine status: Up to date  Covid-19 vaccine status: Completed vaccines  Qualifies for Shingles Vaccine? Yes   Zostavax completed Yes   Shingrix Completed?: No.    Education has been provided regarding the importance of this vaccine. Patient has been  advised to call insurance company to determine out of pocket expense if they have not yet received this vaccine. Advised may  also receive vaccine at local pharmacy or Health Dept. Verbalized acceptance and understanding.  Screening Tests Health Maintenance  Topic Date Due  . TETANUS/TDAP  03/06/2021 (Originally 05/05/2018)  . COLONOSCOPY (Pts 45-17yrs Insurance coverage will need to be confirmed)  07/06/2021  . INFLUENZA VACCINE  Completed  . COVID-19 Vaccine  Completed  . Hepatitis C Screening  Completed  . PNA vac Low Risk Adult  Completed    Health Maintenance  There are no preventive care reminders to display for this patient.  Colorectal cancer screening: Type of screening: Colonoscopy. Completed 07/06/16. Repeat every 5 years  Lung Cancer Screening: (Low Dose CT Chest recommended if Age 23-80 years, 30 pack-year currently smoking OR have quit w/in 15years.) does not qualify.   Additional Screening:  Hepatitis C Screening: Up to date  Vision Screening: Recommended annual ophthalmology exams for early detection of glaucoma and other disorders of the eye. Is the patient up to date with their annual eye exam?  Yes  Who is the provider or what is the name of the office in which the patient attends annual eye exams? [facility] If pt is not established with a provider, would they like to be referred to a provider to establish care? No .   Dental Screening: Recommended annual dental exams for proper oral hygiene  Community Resource Referral / Chronic Care Management: CRR required this visit?  No   CCM required this visit?  No      Plan:     I have personally reviewed and noted the following in the patient's chart:   . Medical and social history . Use of alcohol, tobacco or illicit drugs  . Current medications and supplements . Functional ability and status . Nutritional status . Physical activity . Advanced directives . List of other  physicians . Hospitalizations, surgeries, and ER visits in previous 12 months . Vitals . Screenings to include cognitive, depression, and falls . Referrals and appointments  In addition, I have reviewed and discussed with patient certain preventive protocols, quality metrics, and best practice recommendations. A written personalized care plan for preventive services as well as general preventive health recommendations were provided to patient.     Maresha Anastos [place], [place]   [id number]   Nurse Notes: None.

## 2020-03-06 ENCOUNTER — Ambulatory Visit (INDEPENDENT_AMBULATORY_CARE_PROVIDER_SITE_OTHER): Payer: Medicare Other

## 2020-03-06 ENCOUNTER — Other Ambulatory Visit: Payer: Self-pay

## 2020-03-06 VITALS — BP 112/58 | HR 63 | Temp 97.7°F | Ht 70.0 in | Wt 235.6 lb

## 2020-03-06 DIAGNOSIS — Z0000 Encounter for general adult medical examination without abnormal findings: Secondary | ICD-10-CM

## 2020-03-06 NOTE — Patient Instructions (Signed)
[name] , Thank you for taking time to come for your Medicare Wellness Visit. I appreciate your ongoing commitment to your health goals. Please review the following plan we discussed and let me know if I can assist you in the future.   Screening recommendations/referrals: Colonoscopy: Up to date, due 06/2021 Recommended yearly ophthalmology/optometry visit for glaucoma screening and checkup Recommended yearly dental visit for hygiene and checkup  Vaccinations: Influenza vaccine: Done 10/03/19 Pneumococcal vaccine: Completed series Tdap vaccine: Currently due, declined receiving.  Shingles vaccine: Up to date, due 03/2020  Advanced directives: Please bring a copy of your POA (Power of [place]) and/or Living Will to your next appointment.   Conditions/risks identified: Recommend to start back walking 3 days a week for at least 30 minutes at a time.   Next appointment: 03/12/21 @ 10:20 AM for an AWV. Declined   Preventive Care 70 Years and Older, Male Preventive care refers to lifestyle choices and visits with your health care provider that can promote health and wellness. What does preventive care include?  A yearly physical exam. This is also called an annual well check.  Dental exams once or twice a year.  Routine eye exams. Ask your health care provider how often you should have your eyes checked.  Personal lifestyle choices, including:  Daily care of your teeth and gums.  Regular physical activity.  Eating a healthy diet.  Avoiding tobacco and drug use.  Limiting alcohol use.  Practicing safe sex.  Taking low doses of aspirin every day.  Taking vitamin and mineral supplements as recommended by your health care provider. What happens during an annual well check? The services and screenings done by your health care provider during your annual well check will depend on your age, overall health, lifestyle risk factors, and family history of disease. Counseling  Your  health care provider may ask you questions about your:  Alcohol use.  Tobacco use.  Drug use.  Emotional well-being.  Home and relationship well-being.  Sexual activity.  Eating habits.  History of falls.  Memory and ability to understand (cognition).  Work and work Statistician. Screening  You may have the following tests or measurements:  Height, weight, and BMI.  Blood pressure.  Lipid and cholesterol levels. These may be checked every 5 years, or more frequently if you are over 70 years old.  Skin check.  Lung cancer screening. You may have this screening every year starting at age 70 if you have a 30-pack-year history of smoking and currently smoke or have quit within the past 15 years.  Fecal occult blood test (FOBT) of the stool. You may have this test every year starting at age 70.  Flexible sigmoidoscopy or colonoscopy. You may have a sigmoidoscopy every 5 years or a colonoscopy every 10 years starting at age 70.  Prostate cancer screening. Recommendations will vary depending on your family history and other risks.  Hepatitis C blood test.  Hepatitis B blood test.  Sexually transmitted disease (STD) testing.  Diabetes screening. This is done by checking your blood sugar (glucose) after you have not eaten for a while (fasting). You may have this done every 1-3 years.  Abdominal aortic aneurysm (AAA) screening. You may need this if you are a current or former smoker.  Osteoporosis. You may be screened starting at age 70 if you are at high risk. Talk with your health care provider about your test results, treatment options, and if necessary, the need for more tests. Vaccines  Your health care provider may recommend certain vaccines, such as:  Influenza vaccine. This is recommended every year.  Tetanus, diphtheria, and acellular pertussis (Tdap, Td) vaccine. You may need a Td booster every 10 years.  Zoster vaccine. You may need this after age  70.  Pneumococcal 13-valent conjugate (PCV13) vaccine. One dose is recommended after age 19.  Pneumococcal polysaccharide (PPSV23) vaccine. One dose is recommended after age 2. Talk to your health care provider about which screenings and vaccines you need and how often you need them. This information is not intended to replace advice given to you by your health care provider. Make sure you discuss any questions you have with your health care provider. Document Released: 02/01/2015 Document Revised: 09/25/2015 Document Reviewed: 11/06/2014 Elsevier Interactive Patient Education  2017 [place] Prevention in the Home Falls can cause injuries. They can happen to people of all ages. There are many things you can do to make your home safe and to help prevent falls. What can I do on the outside of my home?  Regularly fix the edges of walkways and driveways and fix any cracks.  Remove anything that might make you trip as you walk through a door, such as a raised step or threshold.  Trim any bushes or trees on the path to your home.  Use bright outdoor lighting.  Clear any walking paths of anything that might make someone trip, such as rocks or tools.  Regularly check to see if handrails are loose or broken. Make sure that both sides of any steps have handrails.  Any raised decks and porches should have guardrails on the edges.  Have any leaves, snow, or ice cleared regularly.  Use sand or salt on walking paths during winter.  Clean up any spills in your garage right away. This includes oil or grease spills. What can I do in the bathroom?  Use night lights.  Install grab bars by the toilet and in the tub and shower. Do not use towel bars as grab bars.  Use non-skid mats or decals in the tub or shower.  If you need to sit down in the shower, use a plastic, non-slip stool.  Keep the floor dry. Clean up any water that spills on the floor as soon as it happens.  Remove  soap buildup in the tub or shower regularly.  Attach bath mats securely with double-sided non-slip rug tape.  Do not have throw rugs and other things on the floor that can make you trip. What can I do in the bedroom?  Use night lights.  Make sure that you have a light by your bed that is easy to reach.  Do not use any sheets or blankets that are too big for your bed. They should not hang down onto the floor.  Have a firm chair that has side arms. You can use this for support while you get dressed.  Do not have throw rugs and other things on the floor that can make you trip. What can I do in the kitchen?  Clean up any spills right away.  Avoid walking on wet floors.  Keep items that you use a lot in easy-to-reach places.  If you need to reach something above you, use a strong step stool that has a grab bar.  Keep electrical cords out of the way.  Do not use floor polish or wax that makes floors slippery. If you must use wax, use non-skid floor wax.  Do  not have throw rugs and other things on the floor that can make you trip. What can I do with my stairs?  Do not leave any items on the stairs.  Make sure that there are handrails on both sides of the stairs and use them. Fix handrails that are broken or loose. Make sure that handrails are as long as the stairways.  Check any carpeting to make sure that it is firmly attached to the stairs. Fix any carpet that is loose or worn.  Avoid having throw rugs at the top or bottom of the stairs. If you do have throw rugs, attach them to the floor with carpet tape.  Make sure that you have a light switch at the top of the stairs and the bottom of the stairs. If you do not have them, ask someone to add them for you. What else can I do to help prevent falls?  Wear shoes that:  Do not have high heels.  Have rubber bottoms.  Are comfortable and fit you well.  Are closed at the toe. Do not wear sandals.  If you use a  stepladder:  Make sure that it is fully opened. Do not climb a closed stepladder.  Make sure that both sides of the stepladder are locked into place.  Ask someone to hold it for you, if possible.  Clearly mark and make sure that you can see:  Any grab bars or handrails.  First and last steps.  Where the edge of each step is.  Use tools that help you move around (mobility aids) if they are needed. These include:  Canes.  Walkers.  Scooters.  Crutches.  Turn on the lights when you go into a dark area. Replace any light bulbs as soon as they burn out.  Set up your furniture so you have a clear path. Avoid moving your furniture around.  If any of your floors are uneven, fix them.  If there are any pets around you, be aware of where they are.  Review your medicines with your doctor. Some medicines can make you feel dizzy. This can increase your chance of falling. Ask your doctor what other things that you can do to help prevent falls. This information is not intended to replace advice given to you by your health care provider. Make sure you discuss any questions you have with your health care provider. Document Released: 11/01/2008 Document Revised: 06/13/2015 Document Reviewed: 02/09/2014 Elsevier Interactive Patient Education  2017 [employer].

## 2020-03-12 ENCOUNTER — Other Ambulatory Visit: Payer: Self-pay

## 2020-03-12 ENCOUNTER — Other Ambulatory Visit: Payer: Self-pay | Admitting: Family Medicine

## 2020-03-12 ENCOUNTER — Telehealth: Payer: Self-pay | Admitting: *Deleted

## 2020-03-12 DIAGNOSIS — F419 Anxiety disorder, unspecified: Secondary | ICD-10-CM

## 2020-03-12 MED ORDER — PAROXETINE HCL 40 MG PO TABS: 40.0000 mg | ORAL_TABLET | Freq: Every day | ORAL | 0 refills | Status: DC

## 2020-03-12 NOTE — Telephone Encounter (Signed)
Requested medication (s) are due for refill today:   Yes Requested medication (s) are on the active medication list:   Yes  Future visit scheduled:   No   Pt is in office this morning at 10:20 for a nurse visit.   Can she schedule her F/U while there?   Thanks   Last ordered: 01/23/2020 #30, 0 refills  Clinic note:   Noticed she has an appt at 10:20 this morning.   She needs to schedule a f/u for this rx.   Can she schedule while there?   Thanks.   Requested Prescriptions  Pending Prescriptions Disp Refills   PARoxetine (PAXIL) 40 MG tablet [Pharmacy Med Name: PARoxetine HCl 40 MG Oral Tablet] 30 tablet 0    Sig: TAKE 1 TABLET BY MOUTH ONCE DAILY (SCHEDULE  FOLLOW  UP  APPOINTMENT)      Psychiatry:  Antidepressants - SSRI Passed - 03/12/2020 10:00 AM      Passed - Valid encounter within last 6 months    Recent Outpatient Visits           1 month ago Viral upper respiratory tract infection   [facility] Birdie Sons, MD   11 months ago Bilateral impacted cerumen   [place], Vickki Muff, PA-C   1 year ago [place], Donald E, MD   1 year ago Erectile dysfunction, unspecified erectile dysfunction type   [facility] Birdie Sons, MD   2 years ago OSA on CPAP   [facility] Birdie Sons, MD

## 2020-03-12 NOTE — Telephone Encounter (Signed)
I called pt and left a message for him to call [facility] to make an appt with Dr. Caryn Section for a check up and medication refills.  I left the office number on the message.

## 2020-03-12 NOTE — Telephone Encounter (Signed)
I called and left a message for him to call [facility] to make an appt with Dr. Caryn Section for his medication refills.  Left office number for him to call.

## 2020-03-12 NOTE — Telephone Encounter (Signed)
Copied from [place] [id number]. Topic: Quick Communication - See Telephone Encounter >> Mar 12, 2020  2:35 PM Loma [place] wrote: CRM for notification. See Telephone encounter for: 03/12/20. Copied from CRMGibbs, Jackquline Denmark, RN     03/12/20 11:21 AM Note I called pt and left a message for him to call [facility] to make an appt with Dr. Caryn Section for a check up and medication refills.  I left the office number on the message.      Pt has made appt, first available 3/14. Pt states message understood as would be refilled but wanted to make sure office knew made appt, as going out to [employer] needs med.

## 2020-03-12 NOTE — Telephone Encounter (Signed)
Courtesy refill given to cover until appointment on 04/01/20, #20/0 refills.

## 2020-04-01 ENCOUNTER — Ambulatory Visit (INDEPENDENT_AMBULATORY_CARE_PROVIDER_SITE_OTHER): Payer: Medicare Other | Admitting: Family Medicine

## 2020-04-01 ENCOUNTER — Encounter: Payer: Self-pay | Admitting: Family Medicine

## 2020-04-01 ENCOUNTER — Other Ambulatory Visit: Payer: Self-pay

## 2020-04-01 VITALS — BP 135/75 | HR 61 | Temp 98.1°F | Resp 16 | Ht 69.5 in | Wt 236.0 lb

## 2020-04-01 DIAGNOSIS — F419 Anxiety disorder, unspecified: Secondary | ICD-10-CM

## 2020-04-01 DIAGNOSIS — G4733 Obstructive sleep apnea (adult) (pediatric): Secondary | ICD-10-CM

## 2020-04-01 DIAGNOSIS — G4700 Insomnia, unspecified: Secondary | ICD-10-CM | POA: Diagnosis not present

## 2020-04-01 DIAGNOSIS — Z125 Encounter for screening for malignant neoplasm of prostate: Secondary | ICD-10-CM | POA: Diagnosis not present

## 2020-04-01 DIAGNOSIS — Z9989 Dependence on other enabling machines and devices: Secondary | ICD-10-CM

## 2020-04-01 DIAGNOSIS — M6208 Separation of muscle (nontraumatic), other site: Secondary | ICD-10-CM

## 2020-04-01 DIAGNOSIS — H9313 Tinnitus, bilateral: Secondary | ICD-10-CM

## 2020-04-01 DIAGNOSIS — E781 Pure hyperglyceridemia: Secondary | ICD-10-CM

## 2020-04-01 NOTE — Progress Notes (Signed)
Established patient visit   Patient: Earl Crawford   DOB: 01-26-1950   70 y.o. Male  MRN: [id number] Visit Date: 04/01/2020  Today's healthcare provider: Lelon Huh, MD   Chief Complaint  Patient presents with  . Hyperlipidemia  . Anxiety  . Sleep Apnea  . Insomnia   Subjective    HPI  Lipid/Cholesterol, Follow-up  Last lipid panel Other pertinent labs  Lab Results  Component Value Date   CHOL 116 12/28/2018   HDL 33 (L) 12/28/2018   LDLCALC 49 12/28/2018   TRIG 207 (H) 12/28/2018   CHOLHDL 3.5 12/28/2018   Lab Results  Component Value Date   ALT 21 12/28/2018   AST 17 12/28/2018   PLT 157 08/04/2018   TSH 3.500 08/02/2018     He was last seen for this 15 months ago.  Management since that visit includes continue atorvastatin.   He reports good compliance with treatment. He is not having side effects.   Symptoms: No chest pain No chest pressure/discomfort  No dyspnea No lower extremity edema  No numbness or tingling of extremity No orthopnea  No palpitations No paroxysmal nocturnal dyspnea  No speech difficulty No syncope   Current diet: well balanced Current exercise: walking  The ASCVD Risk score Mikey Bussing DC Jr., et al., 2013) failed to calculate for the following reasons:   The valid total cholesterol range is 130 to 320 mg/dL  --------------------------------------------------------------------------------------------------- Anxiety, Follow-up  He was last seen for anxiety 12/27/2020. Changes made at last visit include none. Doing very well on QOD paroxetine which he wishes to continue   He reports good compliance with treatment. He reports good tolerance of treatment. He is not having side effects.   He feels his anxiety is mild and Unchanged since last visit.  Symptoms: No chest pain No difficulty concentrating  No dizziness No fatigue  No feelings of losing control No insomnia  No irritable No palpitations  No panic attacks No  racing thoughts  No shortness of breath No sweating  No tremors/shakes    GAD-7 Results GAD-7 Generalized Anxiety Disorder Screening Tool 04/01/2020  1. Feeling Nervous, Anxious, or on Edge 0  2. Not Being Able to Stop or Control Worrying 0  3. Worrying Too Much About Different Things 0  4. Trouble Relaxing 1  5. Being So Restless it's Hard To Sit Still 0  6. Becoming Easily Annoyed or Irritable 0  7. Feeling Afraid As If Something Awful Might Happen 0  Total GAD-7 Score 1  Difficulty At Work, Home, or Getting  Along With Others? Not difficult at all    PHQ-9 Scores PHQ9 SCORE ONLY 03/06/2020 12/28/2018 10/25/2017  PHQ-9 Total Score 0 0 0    ---------------------------------------------------------------------------------------------------  Follow up for OSA on CPAP:  The patient was last seen for this 12/28/2018.   Changes made at last visit include none. He was using CPAP consistently every night and medically benefiting from its use.  He reports good compliance with treatment. He feels that condition is Unchanged. He is not having side effects.   -----------------------------------------------------------------------------------------  Follow up for Insomnia:  The patient was last seen for this 12/28/2018.   Changes made at last visit include none. Doing well on 1/2 150mg  trazodone every night.   He reports good compliance with treatment. He feels that condition is Unchanged. He is not having side effects.   -----------------------------------------------------------------------------------------       Medications: Outpatient Medications Prior to Visit  Medication  Sig  . aspirin 81 MG tablet Take 1 tablet by mouth daily.  Marland Kitchen atorvastatin (LIPITOR) 20 MG tablet Take 1 tablet by mouth once daily  . Cholecalciferol (VITAMIN D3) 5000 units CAPS Take 1 capsule by mouth daily.  Noelle Penner ALLERGY RELIEF, CETIRIZINE, 10 MG tablet Take 1 tablet by mouth once daily  . Multiple  Vitamins-Minerals (MULTIVITAMIN ADULT PO) Take 1 tablet by mouth daily.  . Omega-3 Fatty Acids (FISH OIL) 1200 MG CAPS Take 1 capsule by mouth daily.  Marland Kitchen PARoxetine (PAXIL) 40 MG tablet Take 1 tablet (40 mg total) by mouth daily. OFFICE VISIT NEEDED FOR ADDITIONAL REFILLS (Patient taking differently: Take 40 mg by mouth every other day.)  . tadalafil (CIALIS) 5 MG tablet TAKE 1 TABLET BY MOUTH EVERY OTHER DAY AS NEEDED FOR ERECTILE DYSFUNCTION  . traZODone (DESYREL) 150 MG tablet TAKE 1/2 TO 1 (ONE-HALF TO ONE) TABLET BY MOUTH AT BEDTIME  . [DISCONTINUED] HYDROcodone-homatropine (HYCODAN) 5-1.5 MG/5ML syrup Take 5 mLs by mouth every 8 (eight) hours as needed for cough. (Patient not taking: Reported on 04/01/2020)  . [DISCONTINUED] NON FORMULARY CBD oil daily (Patient not taking: No sig reported)   No facility-administered medications prior to visit.    Review of Systems  Constitutional: Negative for appetite change, chills and fever.  Respiratory: Negative for chest tightness, shortness of breath and wheezing.   Cardiovascular: Negative for chest pain and palpitations.  Gastrointestinal: Negative for abdominal pain, nausea and vomiting.       Objective    BP 135/75 (BP Location: Left Arm, Patient Position: Sitting, Cuff Size: Large)   Pulse 61   Temp 98.1 F (36.7 C) (Temporal)   Resp 16   Ht 5' 9.5" (1.765 m)   Wt 236 lb (107 kg)   BMI 34.35 kg/m     Physical Exam    General: Appearance:    Mildly obese male in no acute distress  Eyes:    PERRL, conjunctiva/corneas clear, EOM's intact       Lungs:     Clear to auscultation bilaterally, respirations unlabored  Heart:    Normal heart rate. Normal rhythm. No murmurs, rubs, or gallops.   Abd:   Prominent diastasis recti  MS:   All extremities are intact.   Neurologic:   Awake, alert, oriented x 3. No apparent focal neurological           defect.         Assessment & Plan      1. Pure hyperglyceridemia He is tolerating  atorvastatin well with no adverse effects.   - CBC - Comprehensive metabolic panel - Lipid panel  2. OSA on CPAP   3. Tinnitus of both ears Stable. No trouble hearing for normal conversations.   4. Insomnia, unspecified type Doing well with trazodone which he takes most nights.   5. Anxiety Doing well since cutting back paroxetine to QOD. He wishes to continue current regiment.   6. Diastasis recti Reassurance. No sign of herniation.   7. Prostate cancer screening  - PSA Total (Reflex To Free) ([facility] only)   He states he had one dose of Shingrix at his pharmacy and is due for the second. Advised he is also due for tetanus vaccine.      The entirety of the information documented in the History of Present Illness, Review of Systems and Physical Exam were personally obtained by me. Portions of this information were initially documented by the [facility]erman and reviewed by me for  thoroughness and accuracy.      Lelon Huh, MD  [facility] [phone] (phone) [phone] (fax)  [place]

## 2020-04-01 NOTE — Patient Instructions (Addendum)
.   Please review the attached list of medications and notify my office if there are any errors.   . It is recommended to engage in 150 minutes of moderate exercise every week.    Marland Kitchen .You are due for a Tdap (tetanus-diptheria-pertussis vaccine) which protects you from tetanus and whooping cough. Please check with your insurance plan or pharmacy regarding coverage for this vaccine.

## 2020-04-02 LAB — CBC
Hematocrit: 47.6 % (ref 37.5–51.0)
Hemoglobin: 16.8 g/dL (ref 13.0–17.7)
MCH: 31.7 pg (ref 26.6–33.0)
MCHC: 35.3 g/dL (ref 31.5–35.7)
MCV: 90 fL (ref 79–97)
Platelets: 159 10*3/uL (ref 150–450)
RBC: 5.3 x10E6/uL (ref 4.14–5.80)
RDW: 12.8 % (ref 11.6–15.4)
WBC: 5.9 10*3/uL (ref 3.4–10.8)

## 2020-04-02 LAB — COMPREHENSIVE METABOLIC PANEL
ALT: 23 IU/L (ref 0–44)
AST: 21 IU/L (ref 0–40)
Albumin/Globulin Ratio: 2.1 (ref 1.2–2.2)
Albumin: 4.8 g/dL (ref 3.8–4.8)
Alkaline Phosphatase: 50 IU/L (ref 44–121)
BUN/Creatinine Ratio: 15 (ref 10–24)
BUN: 19 mg/dL (ref 8–27)
Bilirubin Total: 0.7 mg/dL (ref 0.0–1.2)
CO2: 23 mmol/L (ref 20–29)
Calcium: 9.7 mg/dL (ref 8.6–10.2)
Chloride: 105 mmol/L (ref 96–106)
Creatinine, Ser: 1.26 mg/dL (ref 0.76–1.27)
Globulin, Total: 2.3 g/dL (ref 1.5–4.5)
Glucose: 75 mg/dL (ref 65–99)
Potassium: 4.2 mmol/L (ref 3.5–5.2)
Sodium: 143 mmol/L (ref 134–144)
Total Protein: 7.1 g/dL (ref 6.0–8.5)
eGFR: 61 mL/min/{1.73_m2} (ref >59)

## 2020-04-02 LAB — LIPID PANEL
Chol/HDL Ratio: 3.8 ratio (ref 0.0–5.0)
Cholesterol, Total: 133 mg/dL (ref 100–199)
HDL: 35 mg/dL — ABNORMAL LOW (ref >39)
LDL Chol Calc (NIH): 60 mg/dL (ref 0–99)
Triglycerides: 234 mg/dL — ABNORMAL HIGH (ref 0–149)
VLDL Cholesterol Cal: 38 mg/dL (ref 5–40)

## 2020-04-02 LAB — PSA TOTAL (REFLEX TO FREE): Prostate Specific Ag, Serum: 0.5 ng/mL (ref 0.0–4.0)

## 2020-04-25 ENCOUNTER — Other Ambulatory Visit: Payer: Self-pay | Admitting: Family Medicine

## 2020-04-25 DIAGNOSIS — F419 Anxiety disorder, unspecified: Secondary | ICD-10-CM

## 2020-04-26 ENCOUNTER — Telehealth: Payer: Self-pay | Admitting: Family Medicine

## 2020-04-26 DIAGNOSIS — F419 Anxiety disorder, unspecified: Secondary | ICD-10-CM

## 2020-04-26 MED ORDER — PAROXETINE HCL 40 MG PO TABS: ORAL_TABLET | ORAL | 0 refills | Status: DC

## 2020-04-26 NOTE — Addendum Note (Signed)
Addended by: Jefferson Fuel on: 04/26/2020 10:07 AM   Modules accepted: Orders

## 2020-04-26 NOTE — Telephone Encounter (Signed)
Script has been resent to pharmacy.

## 2020-04-26 NOTE — Telephone Encounter (Signed)
For some reason [facility] is advising pt that they have not received the refill sent yesterday/ maybe a system issue on their part/ can a nurse call pharmacy to confirm refill for PARoxetine (PAXIL) 40 MG tablet   / please advise

## 2020-05-20 ENCOUNTER — Other Ambulatory Visit: Payer: Self-pay | Admitting: Family Medicine

## 2020-05-23 DIAGNOSIS — Z20822 Contact with and (suspected) exposure to covid-19: Secondary | ICD-10-CM | POA: Diagnosis not present

## 2020-05-23 DIAGNOSIS — Z03818 Encounter for observation for suspected exposure to other biological agents ruled out: Secondary | ICD-10-CM | POA: Diagnosis not present

## 2020-05-24 ENCOUNTER — Ambulatory Visit (INDEPENDENT_AMBULATORY_CARE_PROVIDER_SITE_OTHER): Payer: Medicare Other | Admitting: Family Medicine

## 2020-05-24 ENCOUNTER — Encounter: Payer: Self-pay | Admitting: Family Medicine

## 2020-05-24 VITALS — Temp 98.8°F | Ht 70.0 in

## 2020-05-24 DIAGNOSIS — U071 COVID-19: Secondary | ICD-10-CM | POA: Diagnosis not present

## 2020-05-24 NOTE — Progress Notes (Signed)
Telephone Visit    Virtual Visit via Video Note   This visit type was conducted due to national recommendations for restrictions regarding the COVID-19 Pandemic (e.g. social distancing) in an effort to limit this patient's exposure and mitigate transmission in our community. This patient is at least at moderate risk for complications without adequate follow up. This format is felt to be most appropriate for this patient at this time. Physical exam was limited by quality of the audio technology used for the visit.   Patient location: home Provider location: office  I discussed the limitations of evaluation and management by telemedicine and the availability of in person appointments. The patient expressed understanding and agreed to proceed.  Patient: Earl Crawford   DOB: 10-Jan-1951   70 y.o. Male  MRN: [id number] Visit Date: 05/24/2020  Today's healthcare provider: Vernie Murders, PA-C   No chief complaint on file.  Subjective    HPI  Developed  Headache, cough, congestion, lethargy and PND on 05-21-20. Temperature had only been 98.8 and feeling better after a good night's rest last night. Tested positive for COVID at home on 05-22-20 and had a PCR test positive yesterday 05-23-20.   Past Medical History:  Diagnosis Date  . Asthma    as child  . Benign neoplasm of transverse colon   . Bilateral impacted cerumen 11/02/2016  . Boutonniere deformity of finger   . History of measles   . History of mumps   . Hyperlipidemia   . Sleep apnea    not currently using CPAP "too noisy"  . Tinnitus    Past Surgical History:  Procedure Laterality Date  . COLONOSCOPY    . COLONOSCOPY WITH PROPOFOL N/A 07/06/2016   Procedure: COLONOSCOPY WITH PROPOFOL;  Surgeon: Lucilla Lame, MD;  Location: [facility];  Service: Endoscopy;  Laterality: N/A;  sleep apnea  . HERNIA REPAIR  1970   herniorrhapy  . POLYPECTOMY  07/06/2016   Procedure: POLYPECTOMY;  Surgeon: Lucilla Lame, MD;   Location: [facility];  Service: Endoscopy;;   Social History   Tobacco Use  . Smoking status: Former Research scientist (life sciences)  . Smokeless tobacco: Never Used  . Tobacco comment: 1 year in college  Vaping Use  . Vaping Use: Never used  Substance Use Topics  . Alcohol use: Yes    Alcohol/week: 0.0 - 2.0 standard drinks    Comment: 2-4 times a month  . Drug use: No   Family History  Problem Relation Age of Onset  . Macular degeneration Mother   . Memory loss Mother   . Heart attack Father   . Hypertension Father   . Obesity Father   . Heart disease Father   . Diabetes Other   . Cancer Neg Hx    No Known Allergies    Medications: Outpatient Medications Prior to Visit  Medication Sig  . aspirin 81 MG tablet Take 1 tablet by mouth daily.  Marland Kitchen atorvastatin (LIPITOR) 20 MG tablet Take 1 tablet by mouth once daily  . Cholecalciferol (VITAMIN D3) 5000 units CAPS Take 1 capsule by mouth daily.  Noelle Penner ALLERGY RELIEF, CETIRIZINE, 10 MG tablet Take 1 tablet by mouth once daily  . Multiple Vitamins-Minerals (MULTIVITAMIN ADULT PO) Take 1 tablet by mouth daily.  . Omega-3 Fatty Acids (FISH OIL) 1200 MG CAPS Take 1 capsule by mouth daily.  Marland Kitchen PARoxetine (PAXIL) 40 MG tablet TAKE 1 TABLET BY MOUTH ONCE DAILY -  SCHEDULE  FOLLOW  UP  APPT  .  tadalafil (CIALIS) 5 MG tablet TAKE 1 TABLET BY MOUTH EVERY OTHER DAY AS NEEDED FOR ERECTILE DYSFUNCTION  . traZODone (DESYREL) 150 MG tablet TAKE 1/2 TO 1 (ONE-HALF TO ONE) TABLET BY MOUTH AT BEDTIME   No facility-administered medications prior to visit.    Review of Systems  HENT: Positive for congestion.   Respiratory: Positive for cough.   All other systems reviewed and are negative.     Objective     Today's Vitals   05/24/20 1105  Temp: 98.8 F (37.1 C)  Height: 5\' 10"  (1.778 m)   Body mass index is 33.86 kg/m.    Physical Exam: No acute respiratory distress during telephonic interview.    Assessment & Plan     1. COVID-19 virus  infection Having improvement in cough and congestion after a good sleep last night. Cough is diminishing, headache gone and no fever. Has had 2 COVID vaccinations with a booster in the past. Using some left over Hycodan for cough yesterday and Ibuprofen prn. Increase fluid intake and continue these meds if needed. Maintain COVID infection isolation and drink extra fluids. Recheck if no better in 2-3 days.   No follow-ups on file.     I discussed the assessment and treatment plan with the patient. The patient was provided an opportunity to ask questions and all were answered. The patient agreed with the plan and demonstrated an understanding of the instructions.   The patient was advised to call back or seek an in-person evaluation if the symptoms worsen or if the condition fails to improve as anticipated.  I provided 20 minutes of non-face-to-face time during this encounter.  I, Joeanne Robicheaux, PA-C, have reviewed all documentation for this visit. The documentation on 05/24/20 for the exam, diagnosis, procedures, and orders are all accurate and complete.   Vernie Murders, PA-C [employer] [phone] (phone) [phone] (fax)  [place]

## 2020-05-28 DIAGNOSIS — Z20822 Contact with and (suspected) exposure to covid-19: Secondary | ICD-10-CM | POA: Diagnosis not present

## 2020-05-28 DIAGNOSIS — Z03818 Encounter for observation for suspected exposure to other biological agents ruled out: Secondary | ICD-10-CM | POA: Diagnosis not present

## 2020-06-02 ENCOUNTER — Other Ambulatory Visit: Payer: Self-pay | Admitting: Family Medicine

## 2020-06-02 NOTE — Telephone Encounter (Signed)
Requested Prescriptions  Pending Prescriptions Disp Refills  . atorvastatin (LIPITOR) 20 MG tablet [Pharmacy Med Name: Atorvastatin Calcium 20 MG Oral Tablet] 90 tablet 2    Sig: Take 1 tablet by mouth once daily     Cardiovascular:  Antilipid - Statins Failed - 06/02/2020  8:18 AM      Failed - HDL in normal range and within 360 days    HDL  Date Value Ref Range Status  04/01/2020 35 (L) >39 mg/dL Final         Failed - Triglycerides in normal range and within 360 days    Triglycerides  Date Value Ref Range Status  04/01/2020 234 (H) 0 - 149 mg/dL Final         Passed - Total Cholesterol in normal range and within 360 days    Cholesterol, Total  Date Value Ref Range Status  04/01/2020 133 100 - 199 mg/dL Final         Passed - LDL in normal range and within 360 days    LDL Chol Calc (NIH)  Date Value Ref Range Status  04/01/2020 60 0 - 99 mg/dL Final         Passed - Patient is not pregnant      Passed - Valid encounter within last 12 months    Recent Outpatient Visits          1 week ago COVID-19 virus infection   [facility], Vickki Muff, PA-C   2 months ago [place] Birdie Sons, MD   4 months ago Viral upper respiratory tract infection   [facility] Birdie Sons, MD   1 year ago Bilateral impacted cerumen   [place], Vickki Muff, PA-C   1 year ago [place], Kirstie Peri, MD

## 2020-06-18 ENCOUNTER — Encounter: Payer: Self-pay | Admitting: Family Medicine

## 2020-06-18 ENCOUNTER — Telehealth: Payer: Self-pay

## 2020-06-18 NOTE — Telephone Encounter (Signed)
Patient advised that we need a copy of the immunization report from the pharmacy. Patient plans to upload a copy of the immunization report to his mychart account.

## 2020-06-18 NOTE — Telephone Encounter (Signed)
Copied from [place] [id number]. Topic: General - Other >> Jun 18, 2020 12:14 PM Alanda Slim E wrote: Reason for CRM: Pt received his Tetanus shot and shingles #2 shot / He received them today at [facility] at [facility] location / pt wanted to add this to his chart/ please advise

## 2020-06-29 ENCOUNTER — Other Ambulatory Visit: Payer: Self-pay | Admitting: Family Medicine

## 2020-06-29 DIAGNOSIS — F419 Anxiety disorder, unspecified: Secondary | ICD-10-CM

## 2020-06-29 NOTE — Telephone Encounter (Signed)
Pt overdue office visit. Last RF 04/26/20 #30. Sent message to call for appointment. Requested Prescriptions  Pending Prescriptions Disp Refills   PARoxetine (PAXIL) 40 MG tablet [Pharmacy Med Name: PARoxetine HCl 40 MG Oral Tablet] 30 tablet 0    Sig: TAKE 1 TABLET BY MOUTH ONCE DAILY -  NEED  TO  SCHEDULE  FOLLOW  UP      Psychiatry:  Antidepressants - SSRI Passed - 06/29/2020 11:17 AM      Passed - Valid encounter within last 6 months    Recent Outpatient Visits           1 month ago COVID-19 virus infection   [facility], Vickki Muff, PA-C   2 months ago [place] Birdie Sons, MD   5 months ago Viral upper respiratory tract infection   [facility] Birdie Sons, MD   1 year ago Bilateral impacted cerumen   [place], Vickki Muff, PA-C   1 year ago [place], Donald E, MD

## 2020-08-03 ENCOUNTER — Other Ambulatory Visit: Payer: Self-pay | Admitting: Family Medicine

## 2020-08-03 NOTE — Telephone Encounter (Signed)
Requested Prescriptions  Pending Prescriptions Disp Refills  . traZODone (DESYREL) 150 MG tablet [Pharmacy Med Name: traZODone HCl 150 MG Oral Tablet] 90 tablet 0    Sig: TAKE 1/2 TO 1 (ONE-HALF TO ONE) TABLET BY MOUTH AT BEDTIME     Psychiatry: Antidepressants - Serotonin Modulator Passed - 08/03/2020 12:24 PM      Passed - Valid encounter within last 6 months    Recent Outpatient Visits          2 months ago COVID-19 virus infection   [place], PA-C   4 months ago [place] Birdie Sons, MD   6 months ago Viral upper respiratory tract infection   [facility] Birdie Sons, MD   1 year ago Bilateral impacted cerumen   [place], Vickki Muff, PA-C   1 year ago [place], Donald E, MD

## 2020-08-20 ENCOUNTER — Other Ambulatory Visit: Payer: Self-pay | Admitting: Family Medicine

## 2020-09-30 DIAGNOSIS — Z23 Encounter for immunization: Secondary | ICD-10-CM | POA: Diagnosis not present

## 2020-11-30 ENCOUNTER — Other Ambulatory Visit: Payer: Self-pay | Admitting: Family Medicine

## 2020-11-30 NOTE — Telephone Encounter (Signed)
Requested medication (s) are due for refill today: Yes  Requested medication (s) are on the active medication list: Yes  Last refill:  3 months ago  Future visit scheduled: No  Notes to clinic:  Unable to refill per protocol, appointment needed.      Requested Prescriptions  Pending Prescriptions Disp Refills   traZODone (DESYREL) 150 MG tablet [Pharmacy Med Name: traZODone HCl 150 MG Oral Tablet] 60 tablet 0    Sig: TAKE 1/2 TO 1 (ONE-HALF TO ONE) TABLET BY MOUTH AT BEDTIME     Psychiatry: Antidepressants - Serotonin Modulator Failed - 11/30/2020  8:22 AM      Failed - Valid encounter within last 6 months    Recent Outpatient Visits           6 months ago COVID-19 virus infection   [facility], Vickki Muff, PA-C   8 months ago [place] Birdie Sons, MD   10 months ago Viral upper respiratory tract infection   [facility] Birdie Sons, MD   1 year ago Bilateral impacted cerumen   [place], Vickki Muff, PA-C   1 year ago [place], Donald E, MD

## 2021-01-28 ENCOUNTER — Ambulatory Visit (INDEPENDENT_AMBULATORY_CARE_PROVIDER_SITE_OTHER): Payer: Medicare Other | Admitting: Family Medicine

## 2021-01-28 ENCOUNTER — Other Ambulatory Visit: Payer: Self-pay

## 2021-01-28 DIAGNOSIS — H6123 Impacted cerumen, bilateral: Secondary | ICD-10-CM

## 2021-02-03 ENCOUNTER — Telehealth: Payer: Self-pay

## 2021-02-03 DIAGNOSIS — Z1211 Encounter for screening for malignant neoplasm of colon: Secondary | ICD-10-CM

## 2021-02-03 NOTE — Telephone Encounter (Signed)
Copied from [place] [id number]. Topic: General - Other >> Feb 03, 2021 11:15 AM Erick Blinks wrote: Reason for CRM: Pt wants to do cologuard instead of colonoscopy, please advise  Best contact: [phone]

## 2021-02-03 NOTE — Addendum Note (Signed)
Addended by: Lelon Huh E on: 02/03/2021 11:56 AM   Modules accepted: Orders

## 2021-02-10 DIAGNOSIS — Z1211 Encounter for screening for malignant neoplasm of colon: Secondary | ICD-10-CM | POA: Diagnosis not present

## 2021-02-11 ENCOUNTER — Ambulatory Visit: Payer: Medicare Other | Admitting: Family Medicine

## 2021-02-17 LAB — COLOGUARD: COLOGUARD: NEGATIVE

## 2021-02-22 ENCOUNTER — Other Ambulatory Visit: Payer: Self-pay | Admitting: Family Medicine

## 2021-02-22 DIAGNOSIS — N529 Male erectile dysfunction, unspecified: Secondary | ICD-10-CM

## 2021-03-07 ENCOUNTER — Telehealth: Payer: Self-pay

## 2021-03-07 NOTE — Telephone Encounter (Signed)
Copied from [place] #[id number]. Topic: Appointment Scheduling - Scheduling Inquiry for Clinic >> Mar 07, 2021 11:25 AM Erick Blinks wrote: Reason for CRM: Pt wants to reschedule his medicare AWV for sometime in March. Please advise   Best contact: [phone]

## 2021-03-19 ENCOUNTER — Ambulatory Visit (INDEPENDENT_AMBULATORY_CARE_PROVIDER_SITE_OTHER): Payer: Medicare Other

## 2021-03-19 ENCOUNTER — Other Ambulatory Visit: Payer: Self-pay

## 2021-03-19 VITALS — BP 112/76 | HR 50 | Temp 98.2°F | Ht 70.0 in | Wt 228.5 lb

## 2021-03-19 DIAGNOSIS — Z0000 Encounter for general adult medical examination without abnormal findings: Secondary | ICD-10-CM | POA: Diagnosis not present

## 2021-03-19 NOTE — Patient Instructions (Addendum)
[name] , Thank you for taking time to come for your Medicare Wellness Visit. I appreciate your ongoing commitment to your health goals. Please review the following plan we discussed and let me know if I can assist you in the future.   Screening recommendations/referrals: Colonoscopy: Cologuard  02/17/21 Recommended yearly ophthalmology/optometry visit for glaucoma screening and checkup Recommended yearly dental visit for hygiene and checkup  Vaccinations: Influenza vaccine: had at local pharmacy Pneumococcal vaccine: 05/18/16 Tdap vaccine: 06/18/20 Shingles vaccine: Shingrix 06/18/20   Covid-19: 02/26/19, 03/29/19, 11/15/19, 09/30/20  Advanced directives: yes  Conditions/risks identified: none  Next appointment: Follow up in one year for your annual wellness visit. - 03/23/22 @ 11am in person  Preventive Care 65 Years and Older, Male Preventive care refers to lifestyle choices and visits with your health care provider that can promote health and wellness. What does preventive care include? A yearly physical exam. This is also called an annual well check. Dental exams once or twice a year. Routine eye exams. Ask your health care provider how often you should have your eyes checked. Personal lifestyle choices, including: Daily care of your teeth and gums. Regular physical activity. Eating a healthy diet. Avoiding tobacco and drug use. Limiting alcohol use. Practicing safe sex. Taking low doses of aspirin every day. Taking vitamin and mineral supplements as recommended by your health care provider. What happens during an annual well check? The services and screenings done by your health care provider during your annual well check will depend on your age, overall health, lifestyle risk factors, and family history of disease. Counseling  Your health care provider may ask you questions about your: Alcohol use. Tobacco use. Drug use. Emotional well-being. Home and relationship  well-being. Sexual activity. Eating habits. History of falls. Memory and ability to understand (cognition). Work and work Statistician. Screening  You may have the following tests or measurements: Height, weight, and BMI. Blood pressure. Lipid and cholesterol levels. These may be checked every 5 years, or more frequently if you are over 30 years old. Skin check. Lung cancer screening. You may have this screening every year starting at age 19 if you have a 30-pack-year history of smoking and currently smoke or have quit within the past 15 years. Fecal occult blood test (FOBT) of the stool. You may have this test every year starting at age 42. Flexible sigmoidoscopy or colonoscopy. You may have a sigmoidoscopy every 5 years or a colonoscopy every 10 years starting at age 68. Prostate cancer screening. Recommendations will vary depending on your family history and other risks. Hepatitis C blood test. Hepatitis B blood test. Sexually transmitted disease (STD) testing. Diabetes screening. This is done by checking your blood sugar (glucose) after you have not eaten for a while (fasting). You may have this done every 1-3 years. Abdominal aortic aneurysm (AAA) screening. You may need this if you are a current or former smoker. Osteoporosis. You may be screened starting at age 72 if you are at high risk. Talk with your health care provider about your test results, treatment options, and if necessary, the need for more tests. Vaccines  Your health care provider may recommend certain vaccines, such as: Influenza vaccine. This is recommended every year. Tetanus, diphtheria, and acellular pertussis (Tdap, Td) vaccine. You may need a Td booster every 10 years. Zoster vaccine. You may need this after age 47. Pneumococcal 13-valent conjugate (PCV13) vaccine. One dose is recommended after age 22. Pneumococcal polysaccharide (PPSV23) vaccine. One dose is recommended after  age 80. Talk to your health care  provider about which screenings and vaccines you need and how often you need them. This information is not intended to replace advice given to you by your health care provider. Make sure you discuss any questions you have with your health care provider. Document Released: 02/01/2015 Document Revised: 09/25/2015 Document Reviewed: 11/06/2014 Elsevier Interactive Patient Education  2017 [place] Prevention in the Home Falls can cause injuries. They can happen to people of all ages. There are many things you can do to make your home safe and to help prevent falls. What can I do on the outside of my home? Regularly fix the edges of walkways and driveways and fix any cracks. Remove anything that might make you trip as you walk through a door, such as a raised step or threshold. Trim any bushes or trees on the path to your home. Use bright outdoor lighting. Clear any walking paths of anything that might make someone trip, such as rocks or tools. Regularly check to see if handrails are loose or broken. Make sure that both sides of any steps have handrails. Any raised decks and porches should have guardrails on the edges. Have any leaves, snow, or ice cleared regularly. Use sand or salt on walking paths during winter. Clean up any spills in your garage right away. This includes oil or grease spills. What can I do in the bathroom? Use night lights. Install grab bars by the toilet and in the tub and shower. Do not use towel bars as grab bars. Use non-skid mats or decals in the tub or shower. If you need to sit down in the shower, use a plastic, non-slip stool. Keep the floor dry. Clean up any water that spills on the floor as soon as it happens. Remove soap buildup in the tub or shower regularly. Attach bath mats securely with double-sided non-slip rug tape. Do not have throw rugs and other things on the floor that can make you trip. What can I do in the bedroom? Use night lights. Make  sure that you have a light by your bed that is easy to reach. Do not use any sheets or blankets that are too big for your bed. They should not hang down onto the floor. Have a firm chair that has side arms. You can use this for support while you get dressed. Do not have throw rugs and other things on the floor that can make you trip. What can I do in the kitchen? Clean up any spills right away. Avoid walking on wet floors. Keep items that you use a lot in easy-to-reach places. If you need to reach something above you, use a strong step stool that has a grab bar. Keep electrical cords out of the way. Do not use floor polish or wax that makes floors slippery. If you must use wax, use non-skid floor wax. Do not have throw rugs and other things on the floor that can make you trip. What can I do with my stairs? Do not leave any items on the stairs. Make sure that there are handrails on both sides of the stairs and use them. Fix handrails that are broken or loose. Make sure that handrails are as long as the stairways. Check any carpeting to make sure that it is firmly attached to the stairs. Fix any carpet that is loose or worn. Avoid having throw rugs at the top or bottom of the stairs. If you do  have throw rugs, attach them to the floor with carpet tape. Make sure that you have a light switch at the top of the stairs and the bottom of the stairs. If you do not have them, ask someone to add them for you. What else can I do to help prevent falls? Wear shoes that: Do not have high heels. Have rubber bottoms. Are comfortable and fit you well. Are closed at the toe. Do not wear sandals. If you use a stepladder: Make sure that it is fully opened. Do not climb a closed stepladder. Make sure that both sides of the stepladder are locked into place. Ask someone to hold it for you, if possible. Clearly mark and make sure that you can see: Any grab bars or handrails. First and last steps. Where the  edge of each step is. Use tools that help you move around (mobility aids) if they are needed. These include: Canes. Walkers. Scooters. Crutches. Turn on the lights when you go into a dark area. Replace any light bulbs as soon as they burn out. Set up your furniture so you have a clear path. Avoid moving your furniture around. If any of your floors are uneven, fix them. If there are any pets around you, be aware of where they are. Review your medicines with your doctor. Some medicines can make you feel dizzy. This can increase your chance of falling. Ask your doctor what other things that you can do to help prevent falls. This information is not intended to replace advice given to you by your health care provider. Make sure you discuss any questions you have with your health care provider. Document Released: 11/01/2008 Document Revised: 06/13/2015 Document Reviewed: 02/09/2014 Elsevier Interactive Patient Education  2017 [employer].

## 2021-03-19 NOTE — Progress Notes (Signed)
Subjective:   Earl Crawford is a 71 y.o. male who presents for Medicare Annual/Subsequent preventive examination.  Review of Systems           Objective:    Today's Vitals   03/19/21 1303  BP: 112/76  Pulse: (!) 50  Temp: 98.2 F (36.8 C)  TempSrc: Oral  SpO2: 97%  Weight: 228 lb 8 oz (103.6 kg)  Height: 5\' 10"  (1.778 m)   Body mass index is 32.79 kg/m.  Advanced Directives 03/06/2020 12/28/2018 10/25/2017 07/06/2016 05/13/2015  Does Patient Have a Medical Advance Directive? Yes Yes Yes Yes Yes  Type of Paramedic of [place];Living will [place];Living will [place];Living will [place];Living will [place];Living will  Does patient want to make changes to medical advance directive? - - - No - Patient declined No - Patient declined  Copy of [place] in Chart? No - copy requested No - copy requested No - copy requested No - copy requested -    Current Medications (verified) Outpatient Encounter Medications as of 03/19/2021  Medication Sig   aspirin 81 MG tablet Take 1 tablet by mouth daily.   atorvastatin (LIPITOR) 20 MG tablet Take 1 tablet by mouth once daily   cetirizine (EQ ALLERGY RELIEF, CETIRIZINE,) 10 MG tablet Take 1 tablet by mouth once daily   Cholecalciferol (VITAMIN D3) 5000 units CAPS Take 1 capsule by mouth daily.   Multiple Vitamins-Minerals (MULTIVITAMIN ADULT PO) Take 1 tablet by mouth daily.   Omega-3 Fatty Acids (FISH OIL) 1200 MG CAPS Take 1 capsule by mouth daily.   PARoxetine (PAXIL) 40 MG tablet Take 1 tablet (40 mg total) by mouth daily.   tadalafil (CIALIS) 5 MG tablet TAKE 1 TABLET BY MOUTH EVERY OTHER DAY AS NEEDED FOR ERECTILE DYSFUNCTION   traZODone (DESYREL) 150 MG tablet TAKE 1/2 TO 1 (ONE-HALF TO ONE) TABLET BY MOUTH AT BEDTIME   No facility-administered encounter medications on file as of 03/19/2021.    Allergies  (verified) Patient has no known allergies.   History: Past Medical History:  Diagnosis Date   Asthma    as child   Benign neoplasm of transverse colon    Bilateral impacted cerumen 11/02/2016   Boutonniere deformity of finger    History of measles    History of mumps    Hyperlipidemia    Sleep apnea    not currently using CPAP "too noisy"   Tinnitus    Past Surgical History:  Procedure Laterality Date   COLONOSCOPY     COLONOSCOPY WITH PROPOFOL N/A 07/06/2016   Procedure: COLONOSCOPY WITH PROPOFOL;  Surgeon: Lucilla Lame, MD;  Location: [place];  Service: Endoscopy;  Laterality: N/A;  sleep apnea   HERNIA REPAIR  1970   herniorrhapy   POLYPECTOMY  07/06/2016   Procedure: POLYPECTOMY;  Surgeon: Lucilla Lame, MD;  Location: [place];  Service: Endoscopy;;   Family History  Problem Relation Age of Onset   Macular degeneration Mother    Memory loss Mother    Heart attack Father    Hypertension Father    Obesity Father    Heart disease Father    Diabetes Other    Cancer Neg Hx    Social History   Socioeconomic History   Marital status: Married    Spouse name: Not on file   Number of children: 2   Years of education: Not on file  Highest education level: Bachelor's degree (e.g., BA, AB, BS)  Occupational History   Occupation: Journalist, newspaper    Comment: Retired 2019  Tobacco Use   Smoking status: Former   Smokeless tobacco: Never   Tobacco comments:    1 year in college  Vaping Use   Vaping Use: Never used  Substance and Sexual Activity   Alcohol use: Yes    Alcohol/week: 0.0 - 2.0 standard drinks    Comment: 2-4 times a month   Drug use: No   Sexual activity: Not on file  Other Topics Concern   Not on file  Social History Narrative   Not on file   Social Determinants of Health   Financial Resource Strain: Not on file  Food Insecurity: Not on file  Transportation Needs: Not on file  Physical Activity: Not on file  Stress:  Not on file  Social Connections: Not on file    Tobacco Counseling Counseling given: Not Answered Tobacco comments: 1 year in college   Clinical Intake:  Pre-visit preparation completed: Yes  Pain : No/denies pain     Nutritional Risks: Other (Comment) Diabetes: No  How often do you need to have someone help you when you read instructions, pamphlets, or other written materials from your doctor or pharmacy?: 1 - Never  Diabetic?no  Interpreter Needed?: No  Information entered by :: Kirke Shaggy, LPN   Activities of Daily Living In your present state of health, do you have any difficulty performing the following activities: 03/17/2021 05/24/2020  Hearing? N Y  Comment - tinnitis  Vision? N N  Difficulty concentrating or making decisions? N N  Walking or climbing stairs? N N  Dressing or bathing? N N  Doing errands, shopping? N N  Preparing Food and eating ? N -  Using the Toilet? N -  In the past six months, have you accidently leaked urine? N -  Do you have problems with loss of bowel control? N -  Managing your Medications? N -  Managing your Finances? N -  Housekeeping or managing your Housekeeping? N -  Some recent data might be hidden    Patient Care Team: Birdie Sons, MD as PCP - General (Family Medicine) Lucilla Lame, MD as Consulting Physician (Gastroenterology) [place], [place] any recent Medical Services you may have received from other than [facility] providers in the past year (date may be approximate).     Assessment:   This is a routine wellness examination for [facility].  Hearing/Vision screen No results found.  Dietary issues and exercise activities discussed:     Goals Addressed   None    Depression Screen PHQ 2/9 Scores 05/24/2020 03/06/2020 12/28/2018 10/25/2017 05/18/2016 05/13/2015  PHQ - 2 Score 0 0 0 0 0 0  PHQ- 9 Score 3 - - - 0 1    Fall Risk Fall Risk  03/17/2021 05/24/2020 03/06/2020 12/28/2018 12/14/2018  Falls in  the past year? 0 0 0 0 0  Comment - - - - Emmi Telephone Survey: data to providers prior to load  Number falls in past yr: 0 - 0 0 -  Comment - - - - -  Injury with Fall? 1 - 0 0 -  Follow up - Falls evaluation completed - - -    FALL RISK PREVENTION PERTAINING TO THE HOME:  Any stairs in or around the home? Yes  If so, are there any without handrails? No  Home free of loose throw  rugs in walkways, pet beds, electrical cords, etc? Yes  Adequate lighting in your home to reduce risk of falls? Yes   ASSISTIVE DEVICES UTILIZED TO PREVENT FALLS:  Life alert? No  Use of a cane, walker or w/c? No  Grab bars in the bathroom? Yes  Shower chair or bench in shower? Yes  Elevated toilet seat or a handicapped toilet? Yes    Cognitive Function:Normal cognitive status assessed by direct observation by this Nurse Health Advisor. No abnormalities found.       6CIT Screen 10/25/2017  What Year? 0 points  What month? 0 points  What time? 0 points  Count back from 20 0 points  Months in reverse 0 points  Repeat phrase 0 points  Total Score 0    Immunizations Immunization History  Administered Date(s) Administered   H1N1 11/24/2007   Influenza Split 12/04/2006, 02/08/2009, 12/16/2009, 11/19/2010   Influenza, High Dose Seasonal PF 11/02/2016, 10/25/2017   Influenza-Unspecified 11/17/2014, 10/03/2019   Moderna Sars-Covid-2 Vaccination 02/26/2019, 03/29/2019, 11/15/2019   Pneumococcal Conjugate-13 02/19/2015   Pneumococcal Polysaccharide-23 05/18/2016   Td 01/23/2000, 06/18/2020   Tdap 05/04/2008   Zoster Recombinat (Shingrix) 06/18/2020   Zoster, Live 02/27/2010    TDAP status: Up to date  Flu Vaccine status: Up to date  Pneumococcal vaccine status: Up to date  Covid-19 vaccine status: Completed vaccines  Qualifies for Shingles Vaccine? Yes   Zostavax completed Yes   Shingrix Completed?: Yes  Screening Tests Health Maintenance  Topic Date Due   COVID-19 Vaccine (4 -  Booster for Moderna series) 01/10/2020   Zoster Vaccines- Shingrix (2 of 2) 08/13/2020   COLONOSCOPY (Pts 45-50yrs Insurance coverage will need to be confirmed)  07/06/2021   TETANUS/TDAP  06/19/2030   Pneumonia Vaccine 53+ Years old  Completed   INFLUENZA VACCINE  Completed   Hepatitis C Screening  Completed   HPV VACCINES  Aged Out    Health Maintenance  Health Maintenance Due  Topic Date Due   COVID-19 Vaccine (4 - Booster for Moderna series) 01/10/2020   Zoster Vaccines- Shingrix (2 of 2) 08/13/2020    Colorectal cancer screening: Type of screening: Cologuard. Completed 02/17/21. Repeat every 3 years  Lung Cancer Screening: (Low Dose CT Chest recommended if Age 30-80 years, 30 pack-year currently smoking OR have quit w/in 15years.) does not qualify.     Additional Screening:  Hepatitis C Screening: does qualify; Completed 05/13/15  Vision Screening: Recommended annual ophthalmology exams for early detection of glaucoma and other disorders of the eye. Is the patient up to date with their annual eye exam?  Yes  Who is the provider or what is the name of the office in which the patient attends annual eye exams? [facility] If pt is not established with a provider, would they like to be referred to a provider to establish care? No .   Dental Screening: Recommended annual dental exams for proper oral hygiene  Community Resource Referral / Chronic Care Management: CRR required this visit?  No   CCM required this visit?  No      Plan:     I have personally reviewed and noted the following in the patients chart:   Medical and social history Use of alcohol, tobacco or illicit drugs  Current medications and supplements including opioid prescriptions. Patient is not currently taking opioid prescriptions. Functional ability and status Nutritional status Physical activity Advanced directives List of other physicians Hospitalizations, surgeries, and ER visits in  previous 12 months  Vitals Screenings to include cognitive, depression, and falls Referrals and appointments  In addition, I have reviewed and discussed with patient certain preventive protocols, quality metrics, and best practice recommendations. A written personalized care plan for preventive services as well as general preventive health recommendations were provided to patient.     Dionisio David, LPN   05/24/9792   Nurse Notes: none

## 2021-04-28 ENCOUNTER — Other Ambulatory Visit: Payer: Self-pay | Admitting: Family Medicine

## 2021-04-28 DIAGNOSIS — N529 Male erectile dysfunction, unspecified: Secondary | ICD-10-CM

## 2021-07-04 ENCOUNTER — Ambulatory Visit (INDEPENDENT_AMBULATORY_CARE_PROVIDER_SITE_OTHER): Payer: Medicare Other | Admitting: Family Medicine

## 2021-07-04 ENCOUNTER — Encounter: Payer: Self-pay | Admitting: Family Medicine

## 2021-07-04 VITALS — BP 137/76 | HR 65 | Ht 69.0 in | Wt 219.7 lb

## 2021-07-04 DIAGNOSIS — F419 Anxiety disorder, unspecified: Secondary | ICD-10-CM

## 2021-07-04 DIAGNOSIS — E781 Pure hyperglyceridemia: Secondary | ICD-10-CM

## 2021-07-04 DIAGNOSIS — E669 Obesity, unspecified: Secondary | ICD-10-CM

## 2021-07-04 DIAGNOSIS — N529 Male erectile dysfunction, unspecified: Secondary | ICD-10-CM | POA: Diagnosis not present

## 2021-07-04 DIAGNOSIS — Z6832 Body mass index (BMI) 32.0-32.9, adult: Secondary | ICD-10-CM | POA: Diagnosis not present

## 2021-07-04 DIAGNOSIS — M6208 Separation of muscle (nontraumatic), other site: Secondary | ICD-10-CM | POA: Diagnosis not present

## 2021-07-04 DIAGNOSIS — Z9989 Dependence on other enabling machines and devices: Secondary | ICD-10-CM

## 2021-07-04 DIAGNOSIS — G4733 Obstructive sleep apnea (adult) (pediatric): Secondary | ICD-10-CM

## 2021-07-04 DIAGNOSIS — G4700 Insomnia, unspecified: Secondary | ICD-10-CM | POA: Diagnosis not present

## 2021-07-04 DIAGNOSIS — Z125 Encounter for screening for malignant neoplasm of prostate: Secondary | ICD-10-CM | POA: Diagnosis not present

## 2021-07-04 MED ORDER — PAROXETINE HCL 10 MG PO TABS: 10.0000 mg | ORAL_TABLET | Freq: Every day | ORAL | 3 refills | Status: DC

## 2021-07-04 NOTE — Patient Instructions (Signed)
Please review the attached list of medications and notify my office if there are any errors.   The CDC recommends two doses of Shingrix (the shingles vaccine) separated by 2 to 6 months for adults age 71 years and older. I recommend checking with your insurance plan regarding coverage for this vaccine.   

## 2021-07-04 NOTE — Progress Notes (Signed)
I,Sha'taria Tyson,acting as a Education administrator for Lelon Huh, MD.,have documented all relevant documentation on the behalf of Lelon Huh, MD,as directed by  Lelon Huh, MD while in the presence of Lelon Huh, MD.  Established patient visit   Patient: Earl Crawford   DOB: 1950/07/23   71 y.o. Male  MRN: [id number] Visit Date: 07/04/2021  Today's healthcare provider: Lelon Huh, MD    Had AWV with [facility] on 03/19/2021.  Subjective    HPI  -Reports he will get next shingles at pharmacy Lipid/Cholesterol, Follow-up  Last lipid panel Other pertinent labs  Lab Results  Component Value Date   CHOL 133 04/01/2020   HDL 35 (L) 04/01/2020   LDLCALC 60 04/01/2020   TRIG 234 (H) 04/01/2020   CHOLHDL 3.8 04/01/2020   Lab Results  Component Value Date   ALT 23 04/01/2020   AST 21 04/01/2020   PLT 159 04/01/2020   TSH 3.500 08/02/2018     He was last seen for this 1  year  ago.  Management since that visit includes continuing same medication.  He reports excellent compliance with treatment. He is not having side effects.    Symptoms: No chest pain No chest pressure/discomfort  No dyspnea No lower extremity edema  No numbness or tingling of extremity No orthopnea  No palpitations No paroxysmal nocturnal dyspnea  No speech difficulty No syncope   Current diet: well balanced, no beef Current exercise: walking  The 10-year ASCVD risk score (Arnett DK, et al., 2019) is: 15%  ---------------------------------------------------------------------------------------------------   Anxiety, Follow-up  He was last seen for anxiety 1  year  ago. Changes made at last visit include none; continue same treatment.   He reports good compliance with treatment. He reports good tolerance of treatment. He is not having side effects.   He feels his anxiety is mild and Improved since last visit. He has cut back on paroxetine to '20mg'$  every other day and is doing very well.  However he does report difficulty reaching climax during intercourse. Otherwise Cialis has been working well, which is he taking QOD.   Symptoms: No chest pain No difficulty concentrating  Yes dizziness No fatigue  No feelings of losing control No insomnia  No irritable No palpitations  No panic attacks No racing thoughts  No shortness of breath No sweating  No tremors/shakes    GAD-7 Results    05/24/2020   11:11 AM 04/01/2020   10:33 AM  GAD-7 Generalized Anxiety Disorder Screening Tool  1. Feeling Nervous, Anxious, or on Edge 0 0  2. Not Being Able to Stop or Control Worrying 0 0  3. Worrying Too Much About Different Things 0 0  4. Trouble Relaxing 0 1  5. Being So Restless it's Hard To Sit Still 0 0  6. Becoming Easily Annoyed or Irritable 0 0  7. Feeling Afraid As If Something Awful Might Happen 0 0  Total GAD-7 Score 0 1  Difficulty At Work, Home, or Getting  Along With Others?  Not difficult at all    PHQ-9 Scores    03/19/2021    1:14 PM 05/24/2020   11:11 AM 03/06/2020   10:42 AM  PHQ9 SCORE ONLY  PHQ-9 Total Score 0 3 0    ---------------------------------------------------------------------------------------------------   Follow up for insomnia:  The patient was last seen for this 1  year  ago. Changes made at last visit include none; continue same treatment.  He reports excellent compliance with treatment.  He feels that condition is Improved. He is not having side effects.   -----------------------------------------------------------------------------------------   Medications: Outpatient Medications Prior to Visit  Medication Sig   aspirin 81 MG tablet Take 1 tablet by mouth daily.   atorvastatin (LIPITOR) 20 MG tablet Take 1 tablet by mouth once daily   cetirizine (EQ ALLERGY RELIEF, CETIRIZINE,) 10 MG tablet Take 1 tablet by mouth once daily   Cholecalciferol (VITAMIN D3) 5000 units CAPS Take 1 capsule by mouth daily.   Multiple Vitamins-Minerals  (MULTIVITAMIN ADULT PO) Take 1 tablet by mouth daily.   Omega-3 Fatty Acids (FISH OIL) 1200 MG CAPS Take 1 capsule by mouth daily.   PARoxetine (PAXIL) 40 MG tablet Take 1 tablet (40 mg total) by mouth daily.   tadalafil (CIALIS) 5 MG tablet TAKE 1 TABLET BY MOUTH EVERY OTHER DAY AS NEEDED FOR ERECTILE DYSFUNCTION   traZODone (DESYREL) 150 MG tablet TAKE 1/2 TO 1 (ONE-HALF TO ONE) TABLET BY MOUTH AT BEDTIME   No facility-administered medications prior to visit.    Review of Systems  HENT:  Positive for hearing loss and tinnitus.   Genitourinary:  Positive for frequency and urgency.       Objective    BP 137/76 (BP Location: Left Arm, Patient Position: Sitting, Cuff Size: Normal)   Pulse 65   Ht '5\' 9"'$  (1.753 m)   Wt 219 lb 11.2 oz (99.7 kg)   SpO2 97%   BMI 32.44 kg/m    Physical Exam   General: Appearance:    Mildly obese male in no acute distress  Eyes:    PERRL, conjunctiva/corneas clear, EOM's intact       Lungs:     Clear to auscultation bilaterally, respirations unlabored  Heart:    Normal heart rate. Normal rhythm. No murmurs, rubs, or gallops.    Abd:    Non tender, no masses. Prominent diastasis recti.   MS:   All extremities are intact.    Neurologic:   Awake, alert, oriented x 3. No apparent focal neurological defect.         Assessment & Plan     1. Pure hyperglyceridemia Diet controlled.  - CBC - Comprehensive metabolic panel - Lipid panel  2. Anxiety Doing well since cutting paroxetine to 40QOD. He is having some issues with delayed climax with intercourse. Will reduce further to 1 tablet (10 mg total) by mouth daily.  Dispense: 90 tablet; Refill: 3  3. Erectile dysfunction, unspecified erectile dysfunction type Doing well with QOD tadalafil, advised he could take every day if he prefers.   4. Diastasis recti   5. Class 1 obesity without serious comorbidity with body mass index (BMI) of 32.0 to 32.9 in adult, unspecified obesity type Is doing well  with daily walking and trying to follow health diet.   6. OSA on CPAP  7. Insomnia, unspecified type Trazodone remains effective.   8. Prostate cancer screening -PSA      The entirety of the information documented in the History of Present Illness, Review of Systems and Physical Exam were personally obtained by me. Portions of this information were initially documented by the CMA and reviewed by me for thoroughness and accuracy.     Lelon Huh, MD  [facility] [phone] (phone) [phone] (fax)  [place]

## 2021-07-05 LAB — LIPID PANEL
Chol/HDL Ratio: 3.4 ratio (ref 0.0–5.0)
Cholesterol, Total: 123 mg/dL (ref 100–199)
HDL: 36 mg/dL — ABNORMAL LOW (ref >39)
LDL Chol Calc (NIH): 48 mg/dL (ref 0–99)
Triglycerides: 246 mg/dL — ABNORMAL HIGH (ref 0–149)
VLDL Cholesterol Cal: 39 mg/dL (ref 5–40)

## 2021-07-05 LAB — CBC
Hematocrit: 46 % (ref 37.5–51.0)
Hemoglobin: 16.2 g/dL (ref 13.0–17.7)
MCH: 31.7 pg (ref 26.6–33.0)
MCHC: 35.2 g/dL (ref 31.5–35.7)
MCV: 90 fL (ref 79–97)
Platelets: 156 10*3/uL (ref 150–450)
RBC: 5.11 x10E6/uL (ref 4.14–5.80)
RDW: 13 % (ref 11.6–15.4)
WBC: 5.7 10*3/uL (ref 3.4–10.8)

## 2021-07-05 LAB — COMPREHENSIVE METABOLIC PANEL
ALT: 23 IU/L (ref 0–44)
AST: 16 IU/L (ref 0–40)
Albumin/Globulin Ratio: 2.4 — ABNORMAL HIGH (ref 1.2–2.2)
Albumin: 4.7 g/dL (ref 3.7–4.7)
Alkaline Phosphatase: 42 IU/L — ABNORMAL LOW (ref 44–121)
BUN/Creatinine Ratio: 16 (ref 10–24)
BUN: 20 mg/dL (ref 8–27)
Bilirubin Total: 0.9 mg/dL (ref 0.0–1.2)
CO2: 24 mmol/L (ref 20–29)
Calcium: 9.6 mg/dL (ref 8.6–10.2)
Chloride: 103 mmol/L (ref 96–106)
Creatinine, Ser: 1.24 mg/dL (ref 0.76–1.27)
Globulin, Total: 2 g/dL (ref 1.5–4.5)
Glucose: 79 mg/dL (ref 70–99)
Potassium: 4 mmol/L (ref 3.5–5.2)
Sodium: 140 mmol/L (ref 134–144)
Total Protein: 6.7 g/dL (ref 6.0–8.5)
eGFR: 62 mL/min/{1.73_m2} (ref >59)

## 2021-07-24 ENCOUNTER — Other Ambulatory Visit: Payer: Self-pay

## 2021-07-24 DIAGNOSIS — N529 Male erectile dysfunction, unspecified: Secondary | ICD-10-CM

## 2021-07-24 NOTE — Telephone Encounter (Signed)
Copied from [place] [id number]. Topic: General - Other >> Jul 24, 2021 11:50 AM [name] wrote: Reason for CRM: patient states at 23-16 ov he discussed w/provider changing tadalafil (CIALIS) 5 MG tablet direction from once every other day to once a day  Patient states he has been taking medication once a day and requesting a new rx w/ updated directions  Please fu w/ patient

## 2021-07-25 MED ORDER — TADALAFIL 5 MG PO TABS: ORAL_TABLET | ORAL | 3 refills | Status: DC

## 2021-07-29 ENCOUNTER — Other Ambulatory Visit: Payer: Self-pay | Admitting: Family Medicine

## 2021-07-30 LAB — PSA: Prostate Specific Ag, Serum: 0.5 ng/mL (ref 0.0–4.0)

## 2021-07-30 LAB — SPECIMEN STATUS REPORT

## 2021-08-25 ENCOUNTER — Other Ambulatory Visit: Payer: Self-pay | Admitting: Family Medicine

## 2021-09-15 ENCOUNTER — Encounter: Payer: Self-pay | Admitting: Family Medicine

## 2021-09-15 DIAGNOSIS — H9193 Unspecified hearing loss, bilateral: Secondary | ICD-10-CM

## 2021-10-10 DIAGNOSIS — H903 Sensorineural hearing loss, bilateral: Secondary | ICD-10-CM | POA: Diagnosis not present

## 2021-10-10 DIAGNOSIS — H6123 Impacted cerumen, bilateral: Secondary | ICD-10-CM | POA: Diagnosis not present

## 2021-10-10 DIAGNOSIS — H9313 Tinnitus, bilateral: Secondary | ICD-10-CM | POA: Diagnosis not present

## 2021-11-03 DIAGNOSIS — Z23 Encounter for immunization: Secondary | ICD-10-CM | POA: Diagnosis not present

## 2021-12-21 IMAGING — CR DG SHOULDER 2+V*L*
1 series · 4 of 4 positions shown · non-contrast
Comparison: None.

CLINICAL DATA: Shoulder pain

EXAM:
LEFT SHOULDER - 2+ VIEW

[Series 1: dg shoulder left · 0.14mm/px · 4 of 4 slices shown]
[im 1/4]
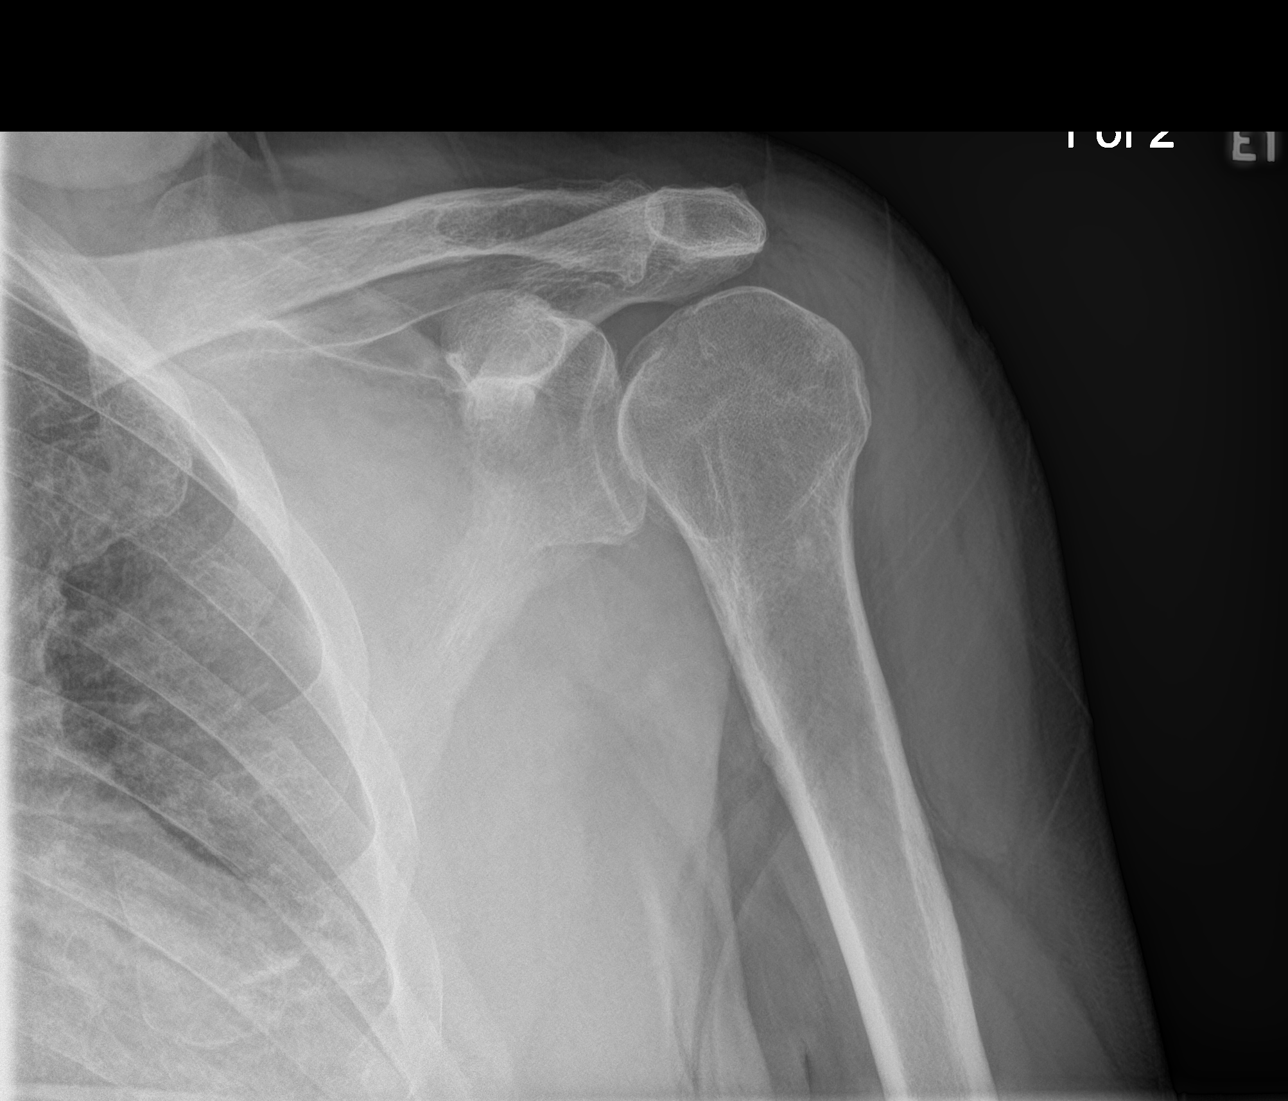
[im 2/4]
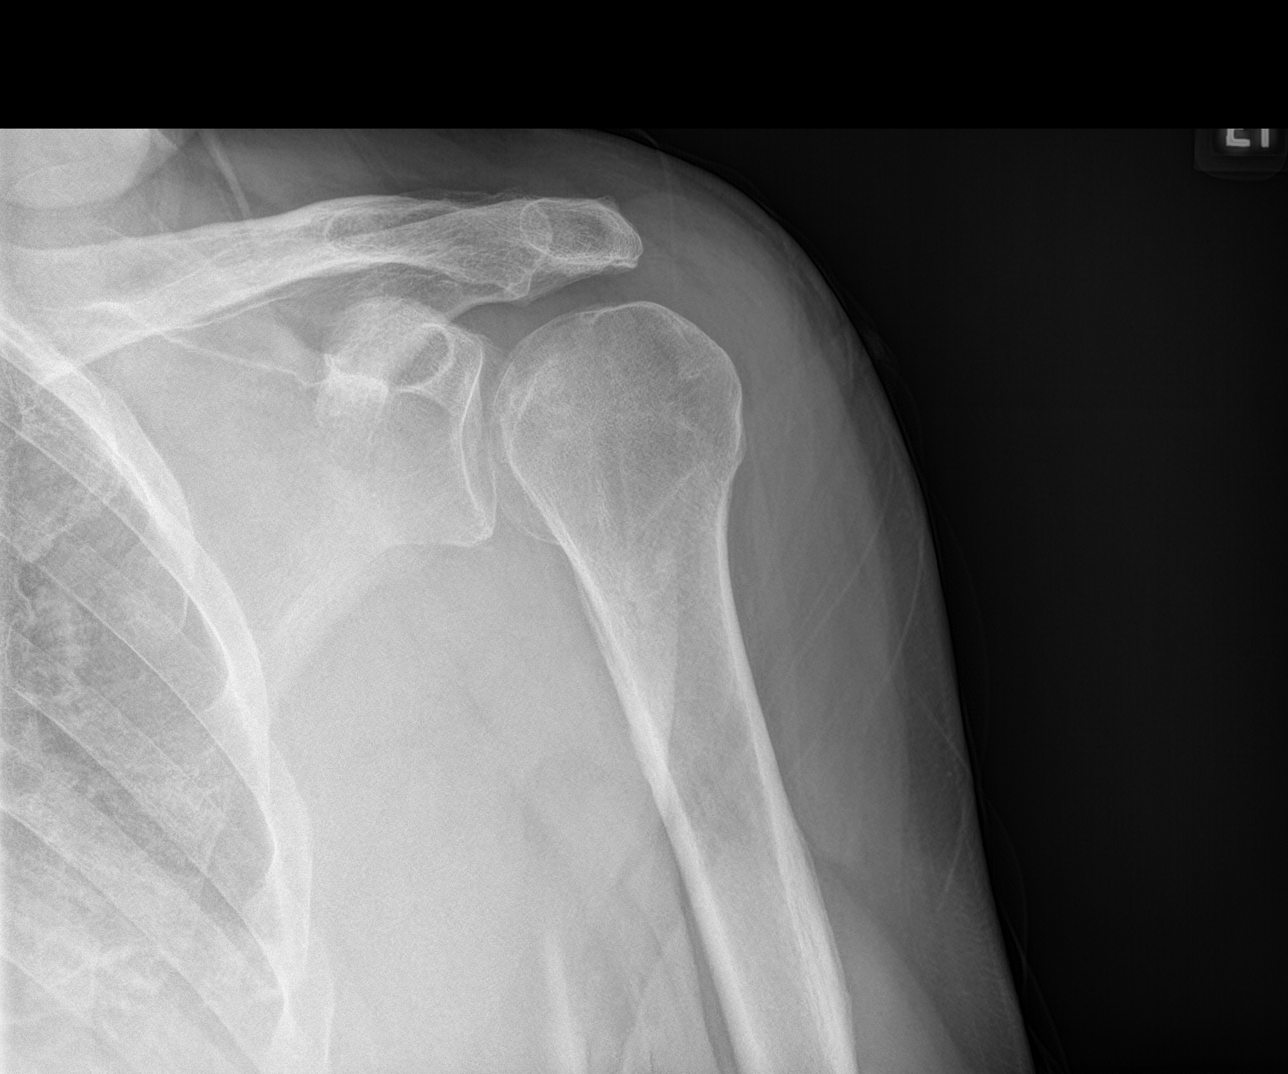
[im 3/4]
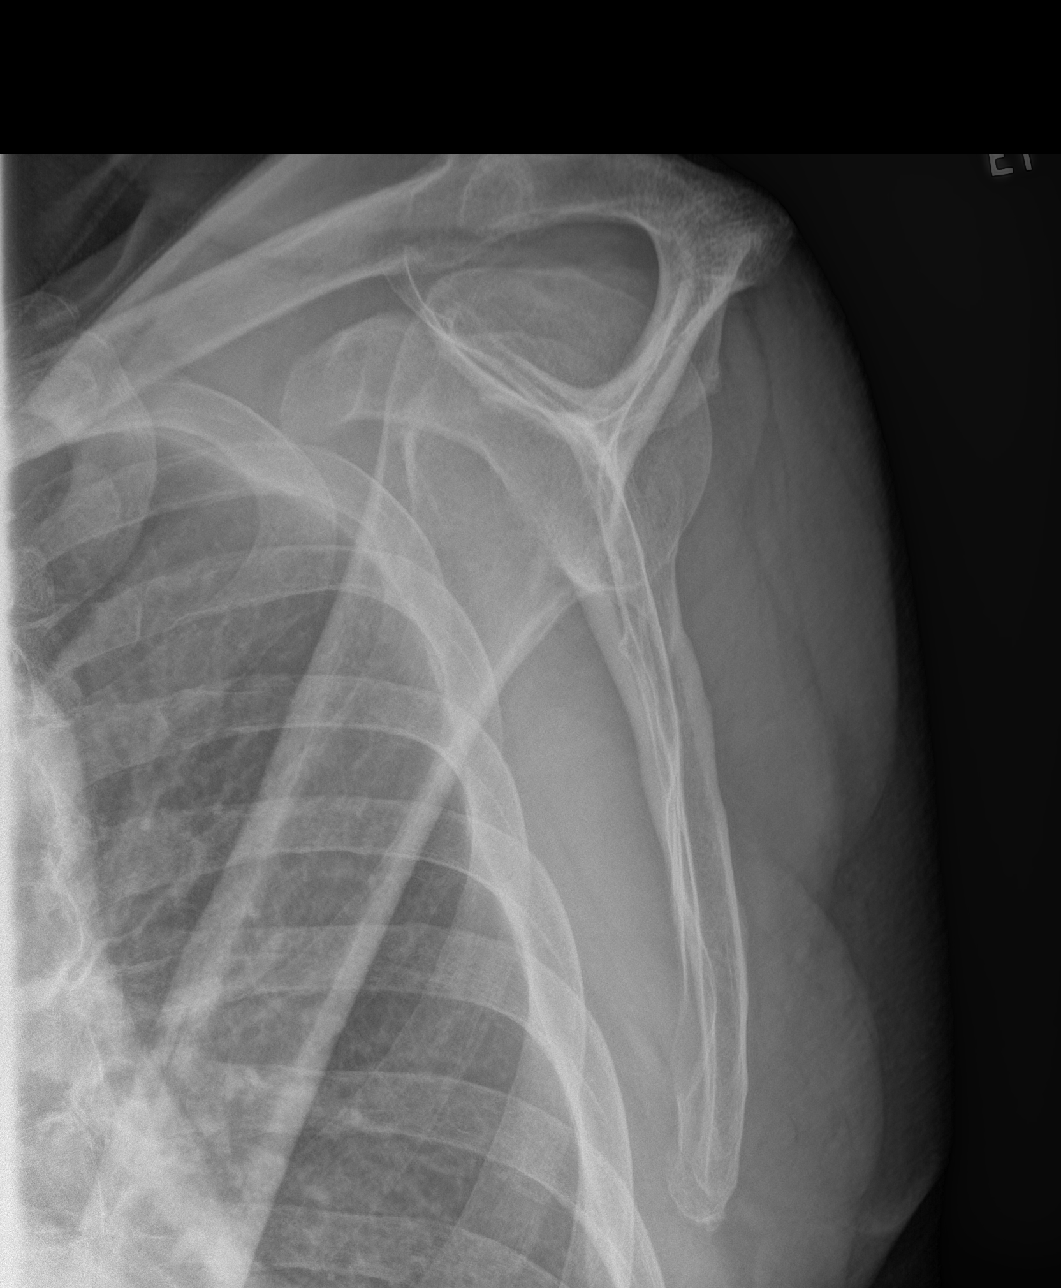
[im 4/4]
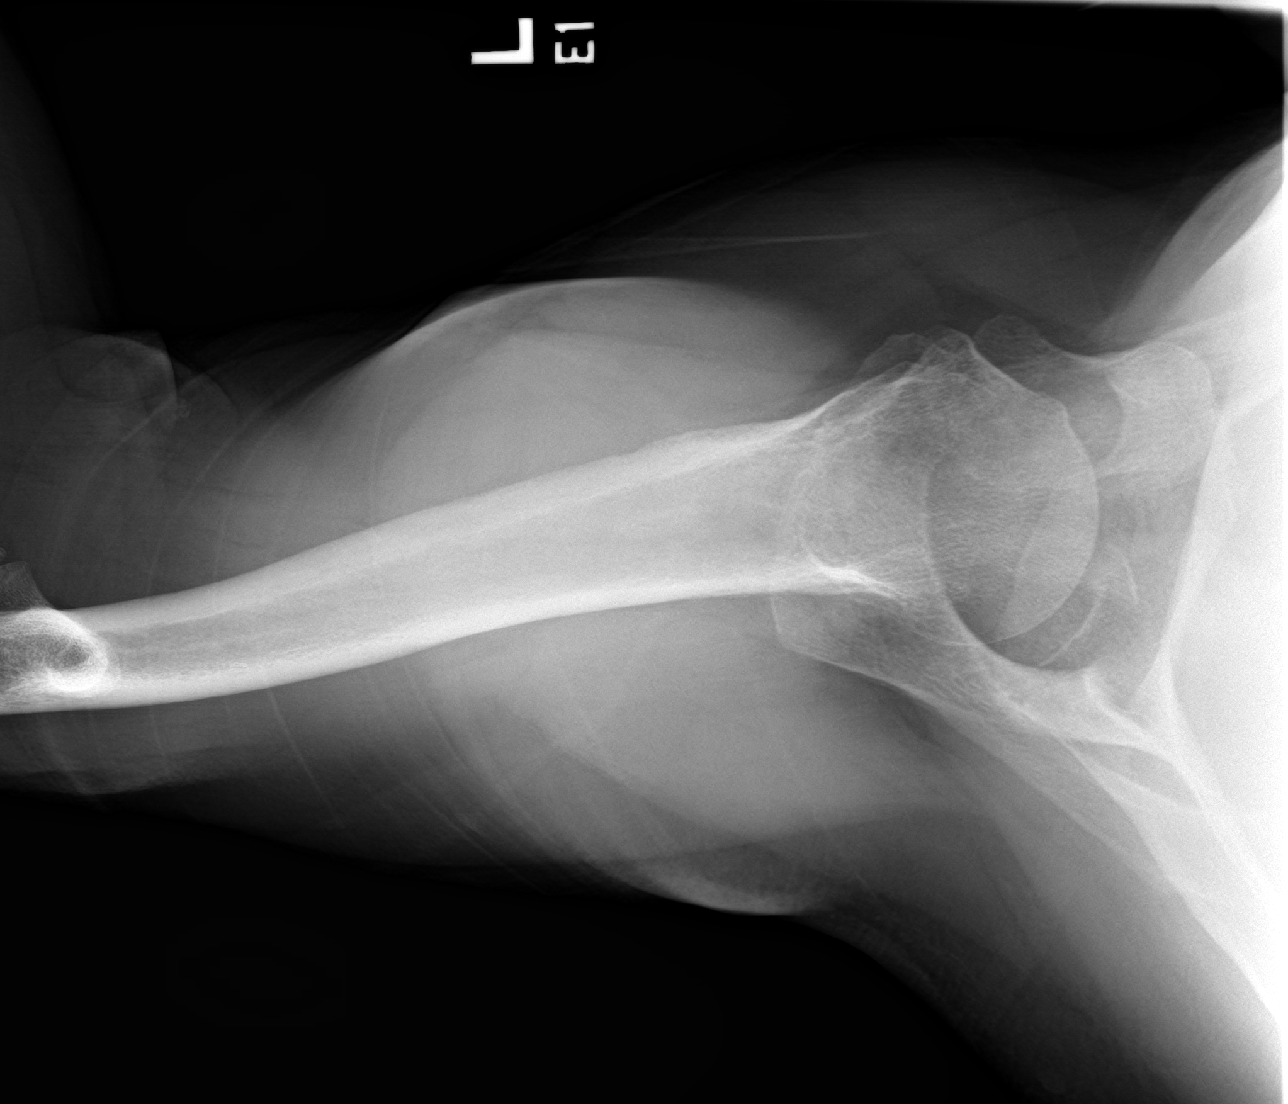

[4 of 4 positions shown; findings below may reference images not displayed]

FINDINGS: There is no evidence of fracture or dislocation. There is no
evidence of arthropathy or other focal bone abnormality. Soft
tissues are unremarkable.
IMPRESSION: Negative.

## 2022-02-09 DIAGNOSIS — H903 Sensorineural hearing loss, bilateral: Secondary | ICD-10-CM | POA: Diagnosis not present

## 2022-02-09 DIAGNOSIS — H6123 Impacted cerumen, bilateral: Secondary | ICD-10-CM | POA: Diagnosis not present

## 2022-03-18 ENCOUNTER — Other Ambulatory Visit: Payer: Self-pay | Admitting: Family Medicine

## 2022-03-18 DIAGNOSIS — N529 Male erectile dysfunction, unspecified: Secondary | ICD-10-CM

## 2022-03-23 ENCOUNTER — Ambulatory Visit (INDEPENDENT_AMBULATORY_CARE_PROVIDER_SITE_OTHER): Payer: Medicare Other

## 2022-03-23 VITALS — BP 124/76 | Ht 69.0 in | Wt 217.9 lb

## 2022-03-23 DIAGNOSIS — Z0000 Encounter for general adult medical examination without abnormal findings: Secondary | ICD-10-CM | POA: Diagnosis not present

## 2022-03-23 NOTE — Progress Notes (Signed)
Subjective:   Earl Crawford is a 72 y.o. male who presents for Medicare Annual/Subsequent preventive examination.  Review of Systems     Cardiac Risk Factors include: advanced age (>21mn, >>59women);dyslipidemia;male gender;obesity (BMI >30kg/m2)     Objective:    Today's Vitals   03/23/22 1108 03/23/22 1116  Weight: 217 lb 14.4 oz (98.8 kg)   Height: '5\' 9"'$  (1.753 m)   PainSc:  4    Body mass index is 32.18 kg/m.     03/23/2022   11:22 AM 03/19/2021    1:17 PM 03/06/2020   10:36 AM 12/28/2018    8:36 AM 10/25/2017    9:32 AM 07/06/2016    8:01 AM 05/13/2015   10:25 AM  Advanced Directives  Does Patient Have a Medical Advance Directive? Yes Yes Yes Yes Yes Yes Yes  Type of ACorporate treasurerof AToveyLiving will HOmegaLiving will HAtchisonLiving will HGastonLiving will HHalfwayLiving will HCusickLiving will  Does patient want to make changes to medical advance directive?  Yes (Inpatient - patient defers changing a medical advance directive and declines information at this time)    No - Patient declined No - Patient declined  Copy of HMetalinein Chart?  Yes - validated most recent copy scanned in chart (See row information) No - copy requested No - copy requested No - copy requested No - copy requested     Current Medications (verified) Outpatient Encounter Medications as of 03/23/2022  Medication Sig   aspirin 81 MG tablet Take 1 tablet by mouth daily.   atorvastatin (LIPITOR) 20 MG tablet Take 1 tablet by mouth once daily   Cholecalciferol (VITAMIN D3) 5000 units CAPS Take 1 capsule by mouth daily.   EQ ALLERGY RELIEF, CETIRIZINE, 10 MG tablet Take 1 tablet by mouth once daily   Multiple Vitamins-Minerals (MULTIVITAMIN ADULT PO) Take 1 tablet by mouth daily.   Omega-3 Fatty Acids (FISH OIL) 1200 MG CAPS Take 1 capsule by mouth  daily.   PARoxetine (PAXIL) 10 MG tablet Take 1 tablet (10 mg total) by mouth daily.   tadalafil (CIALIS) 5 MG tablet TAKE 1 TABLET BY MOUTH EVERY OTHER DAY AS NEEDED FOR ERECTILE DYSFUNCTION   traZODone (DESYREL) 150 MG tablet TAKE 1/2 TO 1 (ONE-HALF TO ONE) TABLET BY MOUTH AT BEDTIME   No facility-administered encounter medications on file as of 03/23/2022.    Allergies (verified) Patient has no known allergies.   History: Past Medical History:  Diagnosis Date   Asthma    as child   Benign neoplasm of transverse colon    Bilateral impacted cerumen 11/02/2016   Boutonniere deformity of finger    History of measles    History of mumps    Hyperlipidemia    Neuromuscular disorder ([place] Sept. 2021   Pain in left shoulder / arm   Sleep apnea    not currently using CPAP "too noisy"   Tinnitus    Past Surgical History:  Procedure Laterality Date   COLONOSCOPY     COLONOSCOPY WITH PROPOFOL N/A 07/06/2016   Procedure: COLONOSCOPY WITH PROPOFOL;  Surgeon: WLucilla Lame MD;  [place] [facility]  Service: Endoscopy;  Laterality: N/A;  sleep apnea   HERNIA REPAIR  1970   herniorrhapy   POLYPECTOMY  07/06/2016   Procedure: POLYPECTOMY;  Surgeon: WLucilla Lame MD;  [place] [facility]  Service: Endoscopy;;  Family History  Problem Relation Age of Onset   Macular degeneration Mother    Memory loss Mother    Heart attack Father    Hypertension Father    Obesity Father    Heart disease Father    Arthritis Father    Early death Father    Diabetes Other    Cancer Neg Hx    Social History   Socioeconomic History   Marital status: Married    Spouse name: Not on file   Number of children: 2   Years of education: Not on file   Highest education level: Bachelor's degree (e.g., BA, AB, BS)  Occupational History   Occupation: Journalist, newspaper    Comment: Retired 2019  Tobacco Use   Smoking status: Former   Smokeless tobacco: Never   Tobacco comments:     N/A  Vaping Use   Vaping Use: Never used  Substance and Sexual Activity   Alcohol use: Yes    Alcohol/week: 1.0 standard drink of alcohol    Types: 1 Glasses of wine per week    Comment: 2-4 times a month   Drug use: No   Sexual activity: Yes    Birth control/protection: Surgical    Comment: Vascectomy  Other Topics Concern   Not on file  Social History Narrative   Not on file   Social Determinants of Health   Financial Resource Strain: Low Risk  (03/20/2022)   Overall Financial Resource Strain (CARDIA)    Difficulty of Paying Living Expenses: Not hard at all  Food Insecurity: No Food Insecurity (03/20/2022)   Hunger Vital Sign    Worried About Running Out of Food in the Last Year: Never true    Ran Out of Food in the Last Year: Never true  Transportation Needs: No Transportation Needs (03/20/2022)   PRAPARE - Hydrologist (Medical): No    Lack of Transportation (Non-Medical): No  Physical Activity: Insufficiently Active (03/20/2022)   Exercise Vital Sign    Days of Exercise per Week: 2 days    Minutes of Exercise per Session: 30 min  Stress: No Stress Concern Present (03/20/2022)   [place]    Feeling of Stress : Not at all  Social Connections: Unknown (03/20/2022)   Social Connection and Isolation Panel [NHANES]    Frequency of Communication with Friends and Family: More than three times a week    Frequency of Social Gatherings with Friends and Family: Once a week    Attends Religious Services: Not on Advertising copywriter or Organizations: Yes    Attends Music therapist: More than 4 times per year    Marital Status: Married    Tobacco Counseling Counseling given: Not Answered Tobacco comments: N/A   Clinical Intake:  Pre-visit preparation completed: Yes  Pain : 0-10 Pain Score: 4  Pain Type: Chronic pain Pain Location: Shoulder Pain Orientation:  Right Pain Descriptors / Indicators: Aching, Tingling Pain Onset: More than a month ago Pain Frequency: Intermittent Pain Relieving Factors: less movemnets  Pain Relieving Factors: less movemnets  BMI - recorded: 32.18 Nutritional Status: BMI > 30  Obese Nutritional Risks: None Diabetes: No  How often do you need to have someone help you when you read instructions, pamphlets, or other written materials from your doctor or pharmacy?: 1 - Never  Diabetic?no  Interpreter Needed?: No  Information entered by :: B.Bora Broner,LPN  Activities of Daily Living    03/20/2022    2:29 PM 06/30/2021   11:40 AM  In your present state of health, do you have any difficulty performing the following activities:  Hearing? 0 0  Vision? 0 0  Difficulty concentrating or making decisions? 0 1  Walking or climbing stairs? 0 0  Dressing or bathing? 0 0  Doing errands, shopping? 0 0  Preparing Food and eating ? N N  Using the Toilet? N N  In the past six months, have you accidently leaked urine? N N  Do you have problems with loss of bowel control? N N  Managing your Medications? N N  Managing your Finances? N N  Housekeeping or managing your Housekeeping? N N    Patient Care Team: Birdie Sons, MD as PCP - General (Family Medicine) Lucilla Lame, MD as Consulting Physician (Gastroenterology) [place], [place] any recent Medical Services you may have received from other than [facility] providers in the past year (date may be approximate).     Assessment:   This is a routine wellness examination for [facility].  Hearing/Vision screen Hearing Screening - Comments:: Hearing adequate with hearing aides Vision Screening - Comments:: Adequate vision [facility]-Oct 2023  Dietary issues and exercise activities discussed: Current Exercise Habits: Home exercise routine, Type of exercise: walking, Time (Minutes): 40, Frequency (Times/Week): 3, Weekly Exercise (Minutes/Week): 120,  Intensity: Mild, Exercise limited by: None identified   Goals Addressed             This Visit's Progress    DIET - EAT MORE FRUITS AND VEGETABLES   On track    Exercise 3x per week (30 min per time)   On track    Recommend to exercise for 3 days a week for at least 30 minutes at a time.        Depression Screen    03/23/2022   11:20 AM 03/19/2021    1:14 PM 05/24/2020   11:11 AM 03/06/2020   10:42 AM 12/28/2018    8:36 AM 10/25/2017    9:33 AM 05/18/2016    2:18 PM  PHQ 2/9 Scores  PHQ - 2 Score 0 0 0 0 0 0 0  PHQ- 9 Score   3    0    Fall Risk    03/20/2022    2:29 PM 06/30/2021   11:40 AM 03/19/2021    1:20 PM 03/17/2021    9:27 AM 05/24/2020   11:11 AM  Fall Risk   Falls in the past year? '1 1 1 '$ 0 0  Number falls in past yr: 0 1 0 0   Injury with Fall? 1 1 0 1   Risk for fall due to :   No Fall Risks    Follow up   Falls prevention discussed  Falls evaluation completed    [place]:  Any stairs in or around the home? Yes  If so, are there any without handrails? Yes  Home free of loose throw rugs in walkways, pet beds, electrical cords, etc? Yes  Adequate lighting in your home to reduce risk of falls? Yes   ASSISTIVE DEVICES UTILIZED TO PREVENT FALLS:  Life alert? No  Use of a cane, walker or w/c? No  Grab bars in the bathroom? Yes  Shower chair or bench in shower? Yes  Elevated toilet seat or a handicapped toilet? No   TIMED UP AND GO:  Was  the test performed? Yes .  Length of time to ambulate 10 feet: 10 sec.   Gait steady and fast without use of assistive device  Cognitive Function:        03/23/2022   11:25 AM 10/25/2017    9:38 AM  6CIT Screen  What Year? 0 points 0 points  What month? 0 points 0 points  What time? 0 points 0 points  Count back from 20 0 points 0 points  Months in reverse 0 points 0 points  Repeat phrase 0 points 0 points  Total Score 0 points 0 points    Immunizations Immunization History   Administered Date(s) Administered   H1N1 11/24/2007   Influenza Split 12/04/2006, 02/08/2009, 12/16/2009, 11/19/2010   Influenza, High Dose Seasonal PF 11/02/2016, 10/25/2017, 09/30/2020   Influenza-Unspecified 11/17/2014, 10/03/2019   Moderna Sars-Covid-2 Vaccination 02/26/2019, 03/29/2019, 11/15/2019   Pneumococcal Conjugate-13 02/19/2015   Pneumococcal Polysaccharide-23 05/18/2016   Td 01/23/2000, 06/18/2020   Tdap 05/04/2008   Zoster Recombinat (Shingrix) 06/18/2020   Zoster, Live 02/27/2010    TDAP status: Up to date  Flu Vaccine status: Up to date  Pneumococcal vaccine status: Up to date  Covid-19 vaccine status: Completed vaccines  Qualifies for Shingles Vaccine? Yes   Zostavax completed Yes   Shingrix Completed?: Yes  Screening Tests Health Maintenance  Topic Date Due   Zoster Vaccines- Shingrix (2 of 2) 08/13/2020   COLONOSCOPY (Pts 45-9yr Insurance coverage will need to be confirmed)  07/06/2021   INFLUENZA VACCINE  08/19/2021   COVID-19 Vaccine (4 - 2023-24 season) 09/19/2021   Medicare Annual Wellness (AWV)  03/23/2023   DTaP/Tdap/Td (4 - Td or Tdap) 06/19/2030   Pneumonia Vaccine 72 Years old  Completed   Hepatitis C Screening  Completed   HPV VACCINES  Aged Out    Health Maintenance  Health Maintenance Due  Topic Date Due   Zoster Vaccines- Shingrix (2 of 2) 08/13/2020   COLONOSCOPY (Pts 45-440yrInsurance coverage will need to be confirmed)  07/06/2021   INFLUENZA VACCINE  08/19/2021   COVID-19 Vaccine (4 - 2023-24 season) 09/19/2021    Colorectal cancer screening: Type of screening: Colonoscopy. Completed yes. Repeat every 5 years  Lung Cancer Screening: (Low Dose CT Chest recommended if Age 72-80ears, 30 pack-year currently smoking OR have quit w/in 15years.) does not qualify.   Lung Cancer Screening Referral: no  Additional Screening:  Hepatitis C Screening: does not qualify; Completed no  Vision Screening: Recommended annual  ophthalmology exams for early detection of glaucoma and other disorders of the eye. Is the patient up to date with their annual eye exam?  Yes  Who is the provider or what is the name of the office in which the patient attends annual eye exams? [facility] If pt is not established with a provider, would they like to be referred to a provider to establish care? No .   Dental Screening: Recommended annual dental exams for proper oral hygiene Recent dental work: crowns and brMidwifeeferral / Chronic Care Management: CRR required this visit?  No   CCM required this visit?  No      Plan:     I have personally reviewed and noted the following in the patient's chart:   Medical and social history Use of alcohol, tobacco or illicit drugs  Current medications and supplements including opioid prescriptions. Patient is not currently taking opioid prescriptions. Functional ability and status Nutritional status Physical activity Advanced directives List of other  physicians Hospitalizations, surgeries, and ER visits in previous 12 months Vitals Screenings to include cognitive, depression, and falls Referrals and appointments  In addition, I have reviewed and discussed with patient certain preventive protocols, quality metrics, and best practice recommendations. A written personalized care plan for preventive services as well as general preventive health recommendations were provided to patient.     Roger Shelter, LPN   [id number]   Nurse Notes: pt relays he is doing well. He reports a sore (little pain at times) shoulder he caught himself (pressing on ground) a few months ago but does not feel he needs any intervention at this time. He has no other concerns or questions.

## 2022-03-23 NOTE — Patient Instructions (Signed)
Earl Crawford , Thank you for taking time to come for your Medicare Wellness Visit. I appreciate your ongoing commitment to your health goals. Please review the following plan we discussed and let me know if I can assist you in the future.   These are the goals we discussed:  Goals      DIET - EAT MORE FRUITS AND VEGETABLES     Exercise 3x per week (30 min per time)     Recommend to exercise for 3 days a week for at least 30 minutes at a time.         This is a list of the screening recommended for you and due dates:  Health Maintenance  Topic Date Due   Zoster (Shingles) Vaccine (2 of 2) 08/13/2020   Colon Cancer Screening  07/06/2021   Flu Shot  08/19/2021   COVID-19 Vaccine (4 - 2023-24 season) 09/19/2021   Medicare Annual Wellness Visit  03/23/2023   DTaP/Tdap/Td vaccine (4 - Td or Tdap) 06/19/2030   Pneumonia Vaccine  Completed   Hepatitis C Screening: USPSTF Recommendation to screen - Ages 89-79 yo.  Completed   HPV Vaccine  Aged Out    Advanced directives: yes  Conditions/risks identified: none  Next appointment: Follow up in one year for your annual wellness visit. 03/24/2023 @ 10:15am in person  Preventive Care 65 Years and Older, Male  Preventive care refers to lifestyle choices and visits with your health care provider that can promote health and wellness. What does preventive care include? A yearly physical exam. This is also called an annual well check. Dental exams once or twice a year. Routine eye exams. Ask your health care provider how often you should have your eyes checked. Personal lifestyle choices, including: Daily care of your teeth and gums. Regular physical activity. Eating a healthy diet. Avoiding tobacco and drug use. Limiting alcohol use. Practicing safe sex. Taking low doses of aspirin every day. Taking vitamin and mineral supplements as recommended by your health care provider. What happens during an annual well check? The services and  screenings done by your health care provider during your annual well check will depend on your age, overall health, lifestyle risk factors, and family history of disease. Counseling  Your health care provider may ask you questions about your: Alcohol use. Tobacco use. Drug use. Emotional well-being. Home and relationship well-being. Sexual activity. Eating habits. History of falls. Memory and ability to understand (cognition). Work and work Statistician. Screening  You may have the following tests or measurements: Height, weight, and BMI. Blood pressure. Lipid and cholesterol levels. These may be checked every 5 years, or more frequently if you are over 57 years old. Skin check. Lung cancer screening. You may have this screening every year starting at age 70 if you have a 30-pack-year history of smoking and currently smoke or have quit within the past 15 years. Fecal occult blood test (FOBT) of the stool. You may have this test every year starting at age 87. Flexible sigmoidoscopy or colonoscopy. You may have a sigmoidoscopy every 5 years or a colonoscopy every 10 years starting at age 2. Prostate cancer screening. Recommendations will vary depending on your family history and other risks. Hepatitis C blood test. Hepatitis B blood test. Sexually transmitted disease (STD) testing. Diabetes screening. This is done by checking your blood sugar (glucose) after you have not eaten for a while (fasting). You may have this done every 1-3 years. Abdominal aortic aneurysm (AAA) screening. You may  need this if you are a current or former smoker. Osteoporosis. You may be screened starting at age 1 if you are at high risk. Talk with your health care provider about your test results, treatment options, and if necessary, the need for more tests. Vaccines  Your health care provider may recommend certain vaccines, such as: Influenza vaccine. This is recommended every year. Tetanus, diphtheria, and  acellular pertussis (Tdap, Td) vaccine. You may need a Td booster every 10 years. Zoster vaccine. You may need this after age 49. Pneumococcal 13-valent conjugate (PCV13) vaccine. One dose is recommended after age 49. Pneumococcal polysaccharide (PPSV23) vaccine. One dose is recommended after age 15. Talk to your health care provider about which screenings and vaccines you need and how often you need them. This information is not intended to replace advice given to you by your health care provider. Make sure you discuss any questions you have with your health care provider. Document Released: 02/01/2015 Document Revised: 09/25/2015 Document Reviewed: 11/06/2014 Elsevier Interactive Patient Education  2017 [place] Prevention in the Home Falls can cause injuries. They can happen to people of all ages. There are many things you can do to make your home safe and to help prevent falls. What can I do on the outside of my home? Regularly fix the edges of walkways and driveways and fix any cracks. Remove anything that might make you trip as you walk through a door, such as a raised step or threshold. Trim any bushes or trees on the path to your home. Use bright outdoor lighting. Clear any walking paths of anything that might make someone trip, such as rocks or tools. Regularly check to see if handrails are loose or broken. Make sure that both sides of any steps have handrails. Any raised decks and porches should have guardrails on the edges. Have any leaves, snow, or ice cleared regularly. Use sand or salt on walking paths during winter. Clean up any spills in your garage right away. This includes oil or grease spills. What can I do in the bathroom? Use night lights. Install grab bars by the toilet and in the tub and shower. Do not use towel bars as grab bars. Use non-skid mats or decals in the tub or shower. If you need to sit down in the shower, use a plastic, non-slip stool. Keep  the floor dry. Clean up any water that spills on the floor as soon as it happens. Remove soap buildup in the tub or shower regularly. Attach bath mats securely with double-sided non-slip rug tape. Do not have throw rugs and other things on the floor that can make you trip. What can I do in the bedroom? Use night lights. Make sure that you have a light by your bed that is easy to reach. Do not use any sheets or blankets that are too big for your bed. They should not hang down onto the floor. Have a firm chair that has side arms. You can use this for support while you get dressed. Do not have throw rugs and other things on the floor that can make you trip. What can I do in the kitchen? Clean up any spills right away. Avoid walking on wet floors. Keep items that you use a lot in easy-to-reach places. If you need to reach something above you, use a strong step stool that has a grab bar. Keep electrical cords out of the way. Do not use floor polish or wax that makes  floors slippery. If you must use wax, use non-skid floor wax. Do not have throw rugs and other things on the floor that can make you trip. What can I do with my stairs? Do not leave any items on the stairs. Make sure that there are handrails on both sides of the stairs and use them. Fix handrails that are broken or loose. Make sure that handrails are as long as the stairways. Check any carpeting to make sure that it is firmly attached to the stairs. Fix any carpet that is loose or worn. Avoid having throw rugs at the top or bottom of the stairs. If you do have throw rugs, attach them to the floor with carpet tape. Make sure that you have a light switch at the top of the stairs and the bottom of the stairs. If you do not have them, ask someone to add them for you. What else can I do to help prevent falls? Wear shoes that: Do not have high heels. Have rubber bottoms. Are comfortable and fit you well. Are closed at the toe. Do not  wear sandals. If you use a stepladder: Make sure that it is fully opened. Do not climb a closed stepladder. Make sure that both sides of the stepladder are locked into place. Ask someone to hold it for you, if possible. Clearly mark and make sure that you can see: Any grab bars or handrails. First and last steps. Where the edge of each step is. Use tools that help you move around (mobility aids) if they are needed. These include: Canes. Walkers. Scooters. Crutches. Turn on the lights when you go into a dark area. Replace any light bulbs as soon as they burn out. Set up your furniture so you have a clear path. Avoid moving your furniture around. If any of your floors are uneven, fix them. If there are any pets around you, be aware of where they are. Review your medicines with your doctor. Some medicines can make you feel dizzy. This can increase your chance of falling. Ask your doctor what other things that you can do to help prevent falls. This information is not intended to replace advice given to you by your health care provider. Make sure you discuss any questions you have with your health care provider. Document Released: 11/01/2008 Document Revised: 06/13/2015 Document Reviewed: 02/09/2014 Elsevier Interactive Patient Education  2017 [employer].

## 2022-04-04 ENCOUNTER — Other Ambulatory Visit: Payer: Self-pay | Admitting: Family Medicine

## 2022-04-22 ENCOUNTER — Ambulatory Visit (INDEPENDENT_AMBULATORY_CARE_PROVIDER_SITE_OTHER): Payer: Medicare Other | Admitting: Family Medicine

## 2022-04-22 ENCOUNTER — Ambulatory Visit: Payer: Medicare Other | Admitting: Physician Assistant

## 2022-04-22 ENCOUNTER — Telehealth: Payer: Self-pay

## 2022-04-22 ENCOUNTER — Other Ambulatory Visit: Payer: Self-pay | Admitting: Family Medicine

## 2022-04-22 VITALS — BP 126/68 | HR 78 | Temp 97.8°F | Resp 12 | Wt 217.0 lb

## 2022-04-22 DIAGNOSIS — S46211S Strain of muscle, fascia and tendon of other parts of biceps, right arm, sequela: Secondary | ICD-10-CM

## 2022-04-22 DIAGNOSIS — N529 Male erectile dysfunction, unspecified: Secondary | ICD-10-CM

## 2022-04-22 DIAGNOSIS — G8929 Other chronic pain: Secondary | ICD-10-CM

## 2022-04-22 NOTE — Assessment & Plan Note (Signed)
Acute, self limiting Referral to ortho to assist with possible surgical intervention Was getting up from yard and pushed down with R arm and felt an odd sensation. Some functional limitations with reach across and reach behind. Still able to push up in chair etc. Poor ability to place palms to sky and place a small weight in flat open palm ex phone Hold steroids given chronic at this point with likely need for surgical intervention

## 2022-04-22 NOTE — Telephone Encounter (Signed)
Requested Prescriptions  Pending Prescriptions Disp Refills   tadalafil (CIALIS) 5 MG tablet [Pharmacy Med Name: Tadalafil 5 MG Oral Tablet] 30 tablet 0    Sig: TAKE 1 TABLET BY MOUTH EVERY OTHER DAY AS NEEDED FOR ERECTILE DYSFUNCTION     Urology: Erectile Dysfunction Agents Passed - 04/22/2022  8:17 AM      Passed - AST in normal range and within 360 days    AST  Date Value Ref Range Status  07/04/2021 16 0 - 40 IU/L Final         Passed - ALT in normal range and within 360 days    ALT  Date Value Ref Range Status  07/04/2021 23 0 - 44 IU/L Final         Passed - Last BP in normal range    BP Readings from Last 1 Encounters:  03/23/22 124/76         Passed - Valid encounter within last 12 months    Recent Outpatient Visits           9 months ago Cameron, Donald E, MD   1 year ago COVID-19 virus infection   [place], [place]   2 years ago Schley, Donald E, MD   2 years ago Viral upper respiratory tract infection   [place], Donald E, MD   3 years ago Bilateral impacted cerumen   [place], [place]       Future Appointments             Today [name], [place], [place]   In 2 months Fisher, Kirstie Peri, MD [facility], [place]

## 2022-04-22 NOTE — Telephone Encounter (Signed)
Copied from [place] [id number]. Topic: Referral - Status >> Apr 22, 2022  3:33 PM Ja-Kwan M wrote: Reason for CRM: Pt requests that the referral be sent to Dr. Thornton Park at [facility] because he already scheduled an appt with him on 05/04/22.

## 2022-04-22 NOTE — Progress Notes (Addendum)
I,[name],acting as a Education administrator for Earl Sprout, FNP.,have documented all relevant documentation on the behalf of Earl Sprout, FNP,as directed by  Earl Sprout, FNP while in the presence of Earl Sprout, FNP.   Established patient visit  Patient: Earl Crawford   DOB: 1950/08/02   72 y.o. Male  MRN: [id number] Visit Date: 04/22/2022  Today's healthcare provider: [name], FNP  Introduced to nurse practitioner role and practice setting.  All questions answered.  Discussed provider/patient relationship and expectations.  Chief Complaint  Patient presents with   Arm Pain   Subjective    HPI  Patient C/O right upper arm pain worse with weight bearing. He reports a few months ago he pushed himself up using right arm. And felt something "pull." He reports pain is sharp and has noticed some swelling. He reports taking Ibuprofen and using OTC rubbing cream, reports a little relief over night.   Medications: Outpatient Medications Prior to Visit  Medication Sig   aspirin 81 MG tablet Take 1 tablet by mouth daily.   atorvastatin (LIPITOR) 20 MG tablet Take 1 tablet by mouth once daily   Cholecalciferol (VITAMIN D3) 5000 units CAPS Take 1 capsule by mouth daily.   EQ ALLERGY RELIEF, CETIRIZINE, 10 MG tablet Take 1 tablet by mouth once daily   Multiple Vitamins-Minerals (MULTIVITAMIN ADULT PO) Take 1 tablet by mouth daily.   Omega-3 Fatty Acids (FISH OIL) 1200 MG CAPS Take 1 capsule by mouth daily.   PARoxetine (PAXIL) 10 MG tablet Take 1 tablet (10 mg total) by mouth daily.   tadalafil (CIALIS) 5 MG tablet TAKE 1 TABLET BY MOUTH EVERY OTHER DAY AS NEEDED FOR ERECTILE DYSFUNCTION   traZODone (DESYREL) 150 MG tablet TAKE 1/2 TO 1 (ONE-HALF TO ONE) TABLET BY MOUTH AT BEDTIME   No facility-administered medications prior to visit.    Review of Systems    Objective    BP 126/68 (BP Location: Left Arm, Patient Position: Sitting, Cuff Size: Large)   Pulse 78   Temp 97.8  F (36.6 C) (Temporal)   Resp 12   Wt 217 lb (98.4 kg)   BMI 32.05 kg/m   Physical Exam Vitals and nursing note reviewed.  Constitutional:      Appearance: Normal appearance. He is obese.  HENT:     Head: Normocephalic and atraumatic.  Cardiovascular:     Rate and Rhythm: Normal rate.     Pulses: Normal pulses.  Pulmonary:     Effort: Pulmonary effort is normal.  Musculoskeletal:        General: Normal range of motion.     Cervical back: Normal range of motion.  Skin:    General: Skin is warm and dry.     Capillary Refill: Capillary refill takes less than 2 seconds.  Neurological:     General: No focal deficit present.     Mental Status: He is alert and oriented to person, place, and time. Mental status is at baseline.     Motor: Weakness present.  Psychiatric:        Mood and Affect: Mood normal.        Behavior: Behavior normal.        Thought Content: Thought content normal.        Judgment: Judgment normal.     No results found for any visits on 04/22/22.  Assessment & Plan     Problem List Items Addressed This Visit  Musculoskeletal and Integument   Biceps muscle tear, right, sequela - Primary    Acute, self limiting Referral to ortho to assist with possible surgical intervention Was getting up from yard and pushed down with R arm and felt an odd sensation. Some functional limitations with reach across and reach behind. Still able to push up in chair etc. Poor ability to place palms to sky and place a small weight in flat open palm ex phone Hold steroids given chronic at this point with likely need for surgical intervention       Relevant Orders   Ambulatory referral to Orthopedics   Please let us know if you do not hear from ortho.    Earl Kotyk, FNP, have reviewed all documentation for this visit. The documentation on 04/22/22 for the exam, diagnosis, procedures, and orders are all accurate and complete.  Earl Crawford, [place] [phone] (phone) [phone] (fax)  [place]

## 2022-04-22 NOTE — Patient Instructions (Signed)
[facility] in [place],  [address]  Phone: [phone]

## 2022-04-23 NOTE — Addendum Note (Signed)
Addended by: Birdie Sons on: 04/23/2022 08:11 AM   Modules accepted: Orders

## 2022-05-04 DIAGNOSIS — M7541 Impingement syndrome of right shoulder: Secondary | ICD-10-CM | POA: Diagnosis not present

## 2022-05-04 DIAGNOSIS — S46211D Strain of muscle, fascia and tendon of other parts of biceps, right arm, subsequent encounter: Secondary | ICD-10-CM | POA: Diagnosis not present

## 2022-05-06 DIAGNOSIS — M7541 Impingement syndrome of right shoulder: Secondary | ICD-10-CM | POA: Diagnosis not present

## 2022-05-06 DIAGNOSIS — S46191D Other injury of muscle, fascia and tendon of long head of biceps, right arm, subsequent encounter: Secondary | ICD-10-CM | POA: Diagnosis not present

## 2022-05-08 DIAGNOSIS — M7541 Impingement syndrome of right shoulder: Secondary | ICD-10-CM | POA: Diagnosis not present

## 2022-05-08 DIAGNOSIS — S46191D Other injury of muscle, fascia and tendon of long head of biceps, right arm, subsequent encounter: Secondary | ICD-10-CM | POA: Diagnosis not present

## 2022-05-11 ENCOUNTER — Ambulatory Visit: Payer: Self-pay | Admitting: *Deleted

## 2022-05-11 NOTE — Telephone Encounter (Signed)
Patient verbalized understanding and states he will look for 1000 IU next time he goes to the pharamcy

## 2022-05-11 NOTE — Telephone Encounter (Signed)
Summary: Medication Advice.   Pt is calling in because he has concerns regarding Cholecalciferol (VITAMIN D3) 5000 units CAPS [[id number]]. Pt says he is concerned that the dosage of 5000 IU is too high and wants to know if that is a safe dose for men and how he should take the vitamins moving forward. Pt can be reached at-[phone]      Called patient [phone] to review medication dose for VIt D3. No answer, LVMTCB [phone].

## 2022-05-11 NOTE — Telephone Encounter (Signed)
1,000 units once a day is usually plenty except for some people with vitamin D deficiency. He doesn't have any signs of vitamin d deficiency

## 2022-05-11 NOTE — Telephone Encounter (Signed)
Reason for Disposition  [1] Caller has NON-URGENT medicine question about med that PCP prescribed AND [2] triager unable to answer question  Answer Assessment - Initial Assessment Questions 1. NAME of MEDICINE: "What medicine(s) are you calling about?"     I ran out of Vitamin D3 as I was filling my pill box.    125 mcg   (5000 IU) on my bottle.    The on line information says that's higher than normal.    I'm experiencing frequent urination which is a side effect of a high dose of Vitamin D3. 2. QUESTION: "What is your question?" (e.g., double dose of medicine, side effect)     Is this dose too high? 3. PRESCRIBER: "Who prescribed the medicine?" Reason: if prescribed by specialist, call should be referred to that group.     Dr. Sherrie Mustache 4. SYMPTOMS: "Do you have any symptoms?" If Yes, ask: "What symptoms are you having?"  "How bad are the symptoms (e.g., mild, moderate, severe)     I was wondering. 5. PREGNANCY:  "Is there any chance that you are pregnant?" "When was your last menstrual period?"     N/A  Protocols used: Medication Question Call-A-AH  Chief Complaint: Pt is taking OTC vitamin D3 5,000 IU daily for a long time.   He happened to look this up on line and it aid the 5000 IU is a high dose fo vitamin D3.   He is wondering what Dr. Sherrie Mustache would recommend.   How much should he be taking? Symptoms: One of the side effects mentioned is frequent urination which he is having. Frequency: Been taking this OTC from [facility] at this strength for a very long time. Pertinent Negatives: Patient denies N/A Disposition: ED /[] Urgent Care (no appt availability in office) / Appointment(In office/virtual)/  [facility]/ Home Care/ Refused Recommended Disposition /[] [facility]/  Follow-up with PCP Additional Notes: Message sent to Dr. Sherrie Mustache.   Pt. Agreeable to someone calling him back.     Did not see where he had had a vitamin D level done in the last 5 years  anyway.    He just started taking the vitamin D3 on his own.     No mention in Dr. Theodis Aguas office notes pertaining to the vitamin D3.

## 2022-05-12 DIAGNOSIS — M7541 Impingement syndrome of right shoulder: Secondary | ICD-10-CM | POA: Diagnosis not present

## 2022-05-12 DIAGNOSIS — S46191D Other injury of muscle, fascia and tendon of long head of biceps, right arm, subsequent encounter: Secondary | ICD-10-CM | POA: Diagnosis not present

## 2022-05-14 DIAGNOSIS — M7541 Impingement syndrome of right shoulder: Secondary | ICD-10-CM | POA: Diagnosis not present

## 2022-05-14 DIAGNOSIS — S46191D Other injury of muscle, fascia and tendon of long head of biceps, right arm, subsequent encounter: Secondary | ICD-10-CM | POA: Diagnosis not present

## 2022-05-19 DIAGNOSIS — S46191D Other injury of muscle, fascia and tendon of long head of biceps, right arm, subsequent encounter: Secondary | ICD-10-CM | POA: Diagnosis not present

## 2022-05-19 DIAGNOSIS — M7541 Impingement syndrome of right shoulder: Secondary | ICD-10-CM | POA: Diagnosis not present

## 2022-05-21 DIAGNOSIS — M7541 Impingement syndrome of right shoulder: Secondary | ICD-10-CM | POA: Diagnosis not present

## 2022-05-21 DIAGNOSIS — S46191D Other injury of muscle, fascia and tendon of long head of biceps, right arm, subsequent encounter: Secondary | ICD-10-CM | POA: Diagnosis not present

## 2022-05-26 DIAGNOSIS — S46191D Other injury of muscle, fascia and tendon of long head of biceps, right arm, subsequent encounter: Secondary | ICD-10-CM | POA: Diagnosis not present

## 2022-05-26 DIAGNOSIS — M7541 Impingement syndrome of right shoulder: Secondary | ICD-10-CM | POA: Diagnosis not present

## 2022-05-28 DIAGNOSIS — M7541 Impingement syndrome of right shoulder: Secondary | ICD-10-CM | POA: Diagnosis not present

## 2022-05-28 DIAGNOSIS — S46191D Other injury of muscle, fascia and tendon of long head of biceps, right arm, subsequent encounter: Secondary | ICD-10-CM | POA: Diagnosis not present

## 2022-05-29 DIAGNOSIS — M7541 Impingement syndrome of right shoulder: Secondary | ICD-10-CM | POA: Diagnosis not present

## 2022-05-29 DIAGNOSIS — S46211D Strain of muscle, fascia and tendon of other parts of biceps, right arm, subsequent encounter: Secondary | ICD-10-CM | POA: Diagnosis not present

## 2022-06-01 DIAGNOSIS — M7541 Impingement syndrome of right shoulder: Secondary | ICD-10-CM | POA: Diagnosis not present

## 2022-06-01 DIAGNOSIS — S46191D Other injury of muscle, fascia and tendon of long head of biceps, right arm, subsequent encounter: Secondary | ICD-10-CM | POA: Diagnosis not present

## 2022-06-05 DIAGNOSIS — M7541 Impingement syndrome of right shoulder: Secondary | ICD-10-CM | POA: Diagnosis not present

## 2022-06-05 DIAGNOSIS — S46191D Other injury of muscle, fascia and tendon of long head of biceps, right arm, subsequent encounter: Secondary | ICD-10-CM | POA: Diagnosis not present

## 2022-06-10 DIAGNOSIS — H903 Sensorineural hearing loss, bilateral: Secondary | ICD-10-CM | POA: Diagnosis not present

## 2022-06-10 DIAGNOSIS — H6123 Impacted cerumen, bilateral: Secondary | ICD-10-CM | POA: Diagnosis not present

## 2022-06-22 ENCOUNTER — Other Ambulatory Visit: Payer: Self-pay | Admitting: Family Medicine

## 2022-06-22 ENCOUNTER — Encounter: Payer: Medicare Other | Admitting: Family Medicine

## 2022-06-22 DIAGNOSIS — N529 Male erectile dysfunction, unspecified: Secondary | ICD-10-CM

## 2022-06-22 NOTE — Telephone Encounter (Signed)
Please advise 

## 2022-07-06 ENCOUNTER — Encounter: Payer: Medicare Other | Admitting: Family Medicine

## 2022-07-15 ENCOUNTER — Encounter: Payer: Medicare Other | Admitting: Family Medicine

## 2022-08-04 ENCOUNTER — Encounter: Payer: Medicare Other | Admitting: Family Medicine

## 2022-08-10 ENCOUNTER — Other Ambulatory Visit: Payer: Self-pay | Admitting: Family Medicine

## 2022-08-10 DIAGNOSIS — N529 Male erectile dysfunction, unspecified: Secondary | ICD-10-CM

## 2022-08-17 ENCOUNTER — Ambulatory Visit (INDEPENDENT_AMBULATORY_CARE_PROVIDER_SITE_OTHER): Payer: Medicare Other | Admitting: Family Medicine

## 2022-08-17 ENCOUNTER — Encounter: Payer: Self-pay | Admitting: Family Medicine

## 2022-08-17 VITALS — BP 131/79 | HR 73 | Ht 69.5 in | Wt 207.8 lb

## 2022-08-17 DIAGNOSIS — G4700 Insomnia, unspecified: Secondary | ICD-10-CM

## 2022-08-17 DIAGNOSIS — H9313 Tinnitus, bilateral: Secondary | ICD-10-CM | POA: Diagnosis not present

## 2022-08-17 DIAGNOSIS — F419 Anxiety disorder, unspecified: Secondary | ICD-10-CM

## 2022-08-17 DIAGNOSIS — Z974 Presence of external hearing-aid: Secondary | ICD-10-CM

## 2022-08-17 DIAGNOSIS — E781 Pure hyperglyceridemia: Secondary | ICD-10-CM | POA: Diagnosis not present

## 2022-08-17 DIAGNOSIS — G4733 Obstructive sleep apnea (adult) (pediatric): Secondary | ICD-10-CM | POA: Diagnosis not present

## 2022-08-17 DIAGNOSIS — Z125 Encounter for screening for malignant neoplasm of prostate: Secondary | ICD-10-CM | POA: Diagnosis not present

## 2022-08-17 DIAGNOSIS — Z86010 Personal history of colonic polyps: Secondary | ICD-10-CM

## 2022-08-17 DIAGNOSIS — Z860101 Personal history of adenomatous and serrated colon polyps: Secondary | ICD-10-CM

## 2022-08-17 NOTE — Patient Instructions (Signed)
Please review the attached list of medications and notify my office if there are any errors.   Limit total amount of vitamin D supplements (including the vitamin D in your multivitamin) to no more than 5,000 units a day

## 2022-08-17 NOTE — Progress Notes (Signed)
      Established patient visit   Patient: Earl Crawford   DOB: 1950/10/30   72 y.o. Male  MRN: [id number] Visit Date: 08/17/2022  Today's healthcare provider: Mila Merry, MD   Chief Complaint  Patient presents with   Hyperlipidemia   Insomnia   Anxiety   Subjective    HPI  Here for general checkup and follow up medications and chronic problems. He feels well without complaints. He did get hearing aids last year. Feels paroxetine is working well to keep his mood leveled out. Trazadone remains very effective at getting him to sleep, although he wakes about 6-6:30, a little earlier than he would like. Remains active, walks frequently, takes care of dots.   Medications: Outpatient Medications Prior to Visit  Medication Sig   atorvastatin (LIPITOR) 20 MG tablet Take 1 tablet by mouth once daily   Cholecalciferol (VITAMIN D3) 5000 units CAPS Take 1 capsule by mouth daily.   EQ ALLERGY RELIEF, CETIRIZINE, 10 MG tablet Take 1 tablet by mouth once daily   Multiple Vitamins-Minerals (MULTIVITAMIN ADULT PO) Take 1 tablet by mouth daily.   Omega-3 Fatty Acids (FISH OIL) 1200 MG CAPS Take 1 capsule by mouth daily.   PARoxetine (PAXIL) 10 MG tablet Take 1 tablet (10 mg total) by mouth daily.   tadalafil (CIALIS) 5 MG tablet TAKE 1 TABLET BY MOUTH EVERY OTHER DAY AS NEEDED FOR ERECTILE DYSFUNCTION   traZODone (DESYREL) 150 MG tablet TAKE 1/2 TO 1 (ONE-HALF TO ONE) TABLET BY MOUTH AT BEDTIME   [DISCONTINUED] aspirin 81 MG tablet Take 1 tablet by mouth daily.   No facility-administered medications prior to visit.     Objective    BP 131/79   Pulse 73   Ht 5' 9.5" (1.765 m)   Wt 207 lb 12.8 oz (94.3 kg)   SpO2 96%   BMI 30.25 kg/m    Physical Exam   General: Appearance:    Mildly obese male in no acute distress  Eyes:    PERRL, conjunctiva/corneas clear, EOM's intact       Lungs:     Clear to auscultation bilaterally, respirations unlabored  Heart:    Normal heart rate.  Normal rhythm. No murmurs, rubs, or gallops.    MS:   All extremities are intact.    Neurologic:   Awake, alert, oriented x 3. No apparent focal neurological defect.         Assessment & Plan     1. Pure hyperglyceridemia He is tolerating atorvastatin well with no adverse effects.   Counseled on risk/benefit ASA therapy and he is going to stop daily aspirin - CBC - Comprehensive metabolic panel - Lipid panel  2. Anxiety Doing well   3. Insomnia, unspecified type Doing well on current dose of trazodone.   4. Tinnitus of both ears Stable, wearing hearing aides.   5. History of adenomatous polyp of colon Last colonoscopy was 2018, he elected to have Cologuard done rather than colonoscopy. Will revisit follow up options next year.   6. OSA on CPAP   7. Wears hearing aid in both ears   8. Prostate cancer screening  - PSA Total (Reflex To Free) ([facility] only)         Mila Merry, MD  [facility] [phone] (phone) [phone] (fax)  [facility]

## 2022-08-21 ENCOUNTER — Telehealth: Payer: Self-pay | Admitting: Family Medicine

## 2022-08-21 NOTE — Telephone Encounter (Signed)
Left detailed message on voicemail advising that he still needs second dose of shingles vaccine. And that we do have the documentation of shingles vaccine from May of 2022.

## 2022-08-21 NOTE — Telephone Encounter (Addendum)
Patient called and stated on MyChart that it said he was over due for Shingles shot. Patient stated he got his Shingles shot at [facility] in [place] in May/2022. Patient said per [facility], that they only had 1 Shingles vaccine on record and patient was inquiring about the 2nd shingles vaccine? Patient was also asking does PCP have record of him getting the first Shingles vaccine at [facility] in May/2022?   Patient then stated he was not sure if this was his 1st or 2nd shingles shot he received at [facility], he does not recall.   Patients callback # [phone]

## 2022-08-21 NOTE — Telephone Encounter (Signed)
Pt called back, I did advise pt that he is still needing his second dose of shingles vaccine. Pt is wanting to know if he is needing to be up to date on any other vaccine before he come in for the second dose of the shingle vaccine. Pt would like a call back to discuss if he is needing anything else.

## 2022-08-24 NOTE — Telephone Encounter (Signed)
Also recommend the RSV vaccine. He will have to get both the Shingrix and RSV from pharmacy since they are only covered by Medicare part D.

## 2022-09-10 ENCOUNTER — Other Ambulatory Visit: Payer: Self-pay | Admitting: Family Medicine

## 2022-09-10 DIAGNOSIS — F419 Anxiety disorder, unspecified: Secondary | ICD-10-CM

## 2022-09-11 NOTE — Telephone Encounter (Signed)
Requested Prescriptions  Pending Prescriptions Disp Refills   PARoxetine (PAXIL) 10 MG tablet [Pharmacy Med Name: PARoxetine HCl 10 MG Oral Tablet] 90 tablet 3    Sig: Take 1 tablet by mouth once daily     Psychiatry:  Antidepressants - SSRI Passed - 09/10/2022 12:22 PM      Passed - Valid encounter within last 6 months    Recent Outpatient Visits           3 weeks ago Pure hyperglyceridemia   [place] [facility] Malva Limes, MD   4 months ago Biceps muscle tear, right, sequela   [facility] Jacky Kindle, FNP   1 year ago Pure hyperglyceridemia   [facility] Malva Limes, MD   2 years ago COVID-19 virus infection   [facility] Chrismon, [place], [place]   2 years ago Pure hyperglyceridemia   [facility] Malva Limes, MD

## 2022-10-23 ENCOUNTER — Telehealth: Payer: Self-pay | Admitting: Family Medicine

## 2022-10-29 ENCOUNTER — Other Ambulatory Visit: Payer: Self-pay | Admitting: Family Medicine

## 2022-11-10 ENCOUNTER — Other Ambulatory Visit: Payer: Self-pay | Admitting: Family Medicine

## 2022-11-13 ENCOUNTER — Other Ambulatory Visit: Payer: Self-pay | Admitting: Family Medicine

## 2022-11-13 ENCOUNTER — Telehealth: Payer: Self-pay | Admitting: Family Medicine

## 2022-11-13 NOTE — Telephone Encounter (Signed)
Filled today

## 2022-11-13 NOTE — Telephone Encounter (Signed)
[facility] pharmacy is requesting prescription refill atorvastatin (LIPITOR) 20 MG tablet  Please advise

## 2022-12-10 DIAGNOSIS — H903 Sensorineural hearing loss, bilateral: Secondary | ICD-10-CM | POA: Diagnosis not present

## 2022-12-10 DIAGNOSIS — H6123 Impacted cerumen, bilateral: Secondary | ICD-10-CM | POA: Diagnosis not present

## 2022-12-25 DIAGNOSIS — Z23 Encounter for immunization: Secondary | ICD-10-CM | POA: Diagnosis not present

## 2022-12-29 ENCOUNTER — Telehealth: Payer: Medicare Other | Admitting: Family Medicine

## 2022-12-29 ENCOUNTER — Telehealth (INDEPENDENT_AMBULATORY_CARE_PROVIDER_SITE_OTHER): Payer: Medicare Other | Admitting: Family Medicine

## 2022-12-29 DIAGNOSIS — G4733 Obstructive sleep apnea (adult) (pediatric): Secondary | ICD-10-CM | POA: Diagnosis not present

## 2022-12-29 NOTE — Progress Notes (Signed)
MyChart Video Visit    Virtual Visit via Video Note   This format is felt to be most appropriate for this patient at this time. Physical exam was limited by quality of the video and audio technology used for the visit.   Patient location: home Provider location: [facility]  I discussed the limitations of evaluation and management by telemedicine and the availability of in person appointments. The patient expressed understanding and agreed to proceed.  Patient: Earl Crawford   DOB: 21-Aug-1950   72 y.o. Male  MRN: [id number] Visit Date: 12/29/2022  Today's healthcare provider: Mila Merry, MD   Subjective     History of Present Illness   The patient, a 72 year old with a long-standing history of sleep apnea, has been using a CPAP machine for the past 18 years. He reports a significant improvement in sleep quality with the use of the CPAP machine, noting that he does not sleep as well without it. Recently, the patient has been experiencing issues with his CPAP headgear, finding it cumbersome and stretched out. The patient has been advised by his CPAP provider, Christoper Allegra, that it is time for an upgrade of his machine and headgear. However, this requires a new prescription and office visit notes.  The patient also reports occasional nocturnal awareness of his heartbeat. He describes this as a normal rate and rhythm, without associated chest pain or pressure, and it does not prevent him from returning to sleep. The patient otherwise feels he is doing well for his age.       Medications: Outpatient Medications Prior to Visit  Medication Sig   atorvastatin (LIPITOR) 20 MG tablet Take 1 tablet by mouth once daily   cetirizine (ZYRTEC) 10 MG tablet Take 1 tablet by mouth once daily   Cholecalciferol (VITAMIN D3) 5000 units CAPS Take 1 capsule by mouth daily.   Multiple Vitamins-Minerals (MULTIVITAMIN ADULT PO) Take 1 tablet by mouth daily.   Omega-3 Fatty Acids (FISH OIL) 1200 MG CAPS Take 1  capsule by mouth daily.   PARoxetine (PAXIL) 10 MG tablet Take 1 tablet by mouth once daily   tadalafil (CIALIS) 5 MG tablet TAKE 1 TABLET BY MOUTH EVERY OTHER DAY AS NEEDED FOR ERECTILE DYSFUNCTION   traZODone (DESYREL) 150 MG tablet TAKE 1/2 TO 1 (ONE-HALF TO ONE) TABLET BY MOUTH AT BEDTIME   No facility-administered medications prior to visit.     Objective    There were no vitals taken for this visit.   Physical Exam  Awake, alert, oriented x 3. In no apparent distress      Assessment & Plan        Obstructive Sleep Apnea Patient reports excellent compliance with CPAP, using 6-8 hours every night and improved sleep quality feel well rested when using device. Current machine is 72 years old and needs replacement. No recent sleep study. -Send prescription and office visit notes to [facility] for new CPAP machine and headgear. -Consider home sleep study if required by insurance.        I discussed the assessment and treatment plan with the patient. The patient was provided an opportunity to ask questions and all were answered. The patient agreed with the plan and demonstrated an understanding of the instructions.   The patient was advised to call back or seek an in-person evaluation if the symptoms worsen or if the condition fails to improve as anticipated.  I provided 12 minutes of non-face-to-face time during this encounter.  Video connection was lost  when less than 50% of the duration of the visit was complete, at which time the remainder of the visit was completed via audio only.   Mila Merry, MD [facility] [phone] (phone) [phone] (fax)  [facility]

## 2023-01-04 ENCOUNTER — Ambulatory Visit (INDEPENDENT_AMBULATORY_CARE_PROVIDER_SITE_OTHER): Payer: Medicare Other | Admitting: Family Medicine

## 2023-01-04 ENCOUNTER — Ambulatory Visit: Payer: Self-pay

## 2023-01-04 VITALS — BP 136/88 | HR 72 | Temp 98.4°F | Resp 16 | Ht 70.0 in | Wt 201.6 lb

## 2023-01-04 DIAGNOSIS — R413 Other amnesia: Secondary | ICD-10-CM

## 2023-01-04 DIAGNOSIS — R002 Palpitations: Secondary | ICD-10-CM

## 2023-01-04 DIAGNOSIS — R634 Abnormal weight loss: Secondary | ICD-10-CM | POA: Diagnosis not present

## 2023-01-04 DIAGNOSIS — R5383 Other fatigue: Secondary | ICD-10-CM | POA: Diagnosis not present

## 2023-01-04 NOTE — Telephone Encounter (Signed)
  Chief Complaint: 20 lb weight loss and wakes up nightly with elevated pulse with forceful beats Symptoms: has OSA and just got a new CPAP machine Frequency: 1 week Pertinent Negatives: Patient denies chest pain, dizziness Disposition: [] ED /[] Urgent Care (no appt availability in office) / [x] Appointment(In office/virtual)/ []  [facility]/ [] Home Care/ [] Refused Recommended Disposition /[] [facility]/ []  Follow-up with PCP Additional Notes: appt today at 1. Reason for Disposition  Age > 60 years  (Exception: Brief heartbeat symptoms that went away and now feels well.)  Answer Assessment - Initial Assessment Questions 1. DESCRIPTION: "Please describe your heart rate or heartbeat that you are having" (e.g., fast/slow, regular/irregular, skipped or extra beats, "palpitations")     Faster at night and occasion  during the day for 30 seconds 2. ONSET: "When did it start?" (Minutes, hours or days)       1 Week  3. DURATION: "How long does it last" (e.g., seconds, minutes, hours)     Happening every night 4. PATTERN "Does it come and go, or has it been constant since it started?"  "Does it get worse with exertion?"   "Are you feeling it now?"     Comes and goes  6. HEART RATE: "Can you tell me your heart rate?" "How many beats in 15 seconds?"  (Note: not all patients can do this)       Cannot say  7. RECURRENT SYMPTOM: "Have you ever had this before?" If Yes, ask: "When was the last time?" and "What happened that time?"      *No Answer* 8. CAUSE: "What do you think is causing the palpitations?"     Unsure  9. CARDIAC HISTORY: "Do you have any history of heart disease?" (e.g., heart attack, angina, bypass surgery, angioplasty, arrhythmia)      *No Answer* 10. OTHER SYMPTOMS: "Do you have any other symptoms?" (e.g., dizziness, chest pain, sweating, difficulty breathing)       20 lb weight loss unexplained and unintentional , wakes up at night can't get back to sleep, feels  like heart is beating fast  Protocols used: Heart Rate and Heartbeat Questions-A-AH

## 2023-01-05 LAB — COMPREHENSIVE METABOLIC PANEL
ALT: 25 [IU]/L (ref 0–44)
AST: 20 [IU]/L (ref 0–40)
Albumin: 4.6 g/dL (ref 3.8–4.8)
Alkaline Phosphatase: 47 [IU]/L (ref 44–121)
BUN/Creatinine Ratio: 14 (ref 10–24)
BUN: 16 mg/dL (ref 8–27)
Bilirubin Total: 0.9 mg/dL (ref 0.0–1.2)
CO2: 24 mmol/L (ref 20–29)
Calcium: 9.9 mg/dL (ref 8.6–10.2)
Chloride: 105 mmol/L (ref 96–106)
Creatinine, Ser: 1.16 mg/dL (ref 0.76–1.27)
Globulin, Total: 1.9 g/dL (ref 1.5–4.5)
Glucose: 85 mg/dL (ref 70–99)
Potassium: 4.1 mmol/L (ref 3.5–5.2)
Sodium: 144 mmol/L (ref 134–144)
Total Protein: 6.5 g/dL (ref 6.0–8.5)
eGFR: 67 mL/min/{1.73_m2} (ref >59)

## 2023-01-05 LAB — CBC WITH DIFFERENTIAL/PLATELET
Basophils Absolute: 0 10*3/uL (ref 0.0–0.2)
Basos: 1 %
EOS (ABSOLUTE): 0.1 10*3/uL (ref 0.0–0.4)
Eos: 2 %
Hematocrit: 46.8 % (ref 37.5–51.0)
Hemoglobin: 16 g/dL (ref 13.0–17.7)
Immature Grans (Abs): 0 10*3/uL (ref 0.0–0.1)
Immature Granulocytes: 0 %
Lymphocytes Absolute: 1.1 10*3/uL (ref 0.7–3.1)
Lymphs: 18 %
MCH: 32.4 pg (ref 26.6–33.0)
MCHC: 34.2 g/dL (ref 31.5–35.7)
MCV: 95 fL (ref 79–97)
Monocytes Absolute: 0.5 10*3/uL (ref 0.1–0.9)
Monocytes: 8 %
Neutrophils Absolute: 4.4 10*3/uL (ref 1.4–7.0)
Neutrophils: 71 %
Platelets: 177 10*3/uL (ref 150–450)
RBC: 4.94 x10E6/uL (ref 4.14–5.80)
RDW: 12.8 % (ref 11.6–15.4)
WBC: 6.1 10*3/uL (ref 3.4–10.8)

## 2023-01-05 LAB — VITAMIN B12: Vitamin B-12: 688 pg/mL (ref 232–1245)

## 2023-01-05 LAB — MAGNESIUM: Magnesium: 2.2 mg/dL (ref 1.6–2.3)

## 2023-01-05 LAB — T4, FREE: Free T4: 1.23 ng/dL (ref 0.82–1.77)

## 2023-01-05 LAB — TSH: TSH: 2.52 u[IU]/mL (ref 0.450–4.500)

## 2023-01-05 NOTE — Progress Notes (Signed)
Established patient visit   Patient: Earl Crawford   DOB: Nov 14, 1950   72 y.o. Male  MRN: [id number] Visit Date: 01/04/2023  Today's healthcare provider: Mila Merry, MD   Chief Complaint  Patient presents with   Irregular Heart Beat    Elevated heart rate no pain just heavy heart beat and losing weight steadily Super tired, sleeps a lot and energy level is down, and concerns with memory   Subjective    Discussed the use of AI scribe software for clinical note transcription with the patient, who gave verbal consent to proceed.  History of Present Illness   The patient, a 72 year old with a history of sleep apnea, presents with unexplained weight loss of 20 pounds since last spring. The weight loss was not intentional and there have been no significant changes in diet or activity level. The patient also reports feeling tired and frequently dozing off during the day, despite having difficulty sleeping at night. This sleep disturbance occurs regardless of CPAP use.  During periods of wakefulness at night, the patient has noticed an unusual sensation of his heart beating hard, described as feeling his pulse more than usual. There have been no associated symptoms such as chest pain or shortness of breath.  The patient denies any changes in appetite, nausea, or swelling in the feet or ankles. However, he has experienced episodes of feeling unusually cold, to the point of shivering, which is a new symptom for him. He also reports no changes in vision, but does note a dislike for driving at night.  In terms of medication, the patient is on a regimen of fish oil, multivitamin, trazodone, and vitamin D. There have been no recent changes in medication. The patient also reports occasional leg cramps, which are new and occur in both legs.  The patient's partner has noticed some recent memory issues, with the patient appearing to have difficulty processing certain tasks. The patient also  acknowledges feeling more forgetful recently.     Wt Readings from Last 5 Encounters:  01/04/23 201 lb 9.6 oz (91.4 kg)  08/17/22 207 lb 12.8 oz (94.3 kg)  04/22/22 217 lb (98.4 kg)  03/23/22 217 lb 14.4 oz (98.8 kg)  07/04/21 219 lb 11.2 oz (99.7 kg)  -  Medications: Outpatient Medications Prior to Visit  Medication Sig   atorvastatin (LIPITOR) 20 MG tablet Take 1 tablet by mouth once daily   cetirizine (ZYRTEC) 10 MG tablet Take 1 tablet by mouth once daily   Cholecalciferol (VITAMIN D3) 5000 units CAPS Take 1 capsule by mouth daily.   Multiple Vitamins-Minerals (MULTIVITAMIN ADULT PO) Take 1 tablet by mouth daily.   Omega-3 Fatty Acids (FISH OIL) 1200 MG CAPS Take 1 capsule by mouth daily.   PARoxetine (PAXIL) 10 MG tablet Take 1 tablet by mouth once daily   tadalafil (CIALIS) 5 MG tablet TAKE 1 TABLET BY MOUTH EVERY OTHER DAY AS NEEDED FOR ERECTILE DYSFUNCTION   traZODone (DESYREL) 150 MG tablet TAKE 1/2 TO 1 (ONE-HALF TO ONE) TABLET BY MOUTH AT BEDTIME   No facility-administered medications prior to visit.   Review of Systems     Objective    BP 136/88   Pulse 72   Temp 98.4 F (36.9 C)   Resp 16   Ht 5\' 10"  (1.778 m)   Wt 201 lb 9.6 oz (91.4 kg)   SpO2 100%   BMI 28.93 kg/m   Physical Exam   General: Appearance:  Well developed, well nourished male in no acute distress  Eyes:    PERRL, conjunctiva/corneas clear, EOM's intact       Lungs:     Clear to auscultation bilaterally, respirations unlabored  Heart:    Normal heart rate. Frequent premature beats. No murmurs, rubs, or gallops.    MS:   All extremities are intact.    Neurologic:   Awake, alert, oriented x 3. No apparent focal neurological defect.       EKG - PACs   Assessment & Plan       Unexplained Weight Loss 20 pounds since last spring without intentional changes in diet or exercise. No changes in appetite or nausea reported. Up to date on colon and prostate cancer screenings -Order  comprehensive metabolic panel, thyroid function tests, and complete blood count to investigate potential causes.  Fatigue and Sleep Disturbance Reports feeling tired and dozing off during the day. Difficulty sleeping at night, waking up frequently. CPAP machine ordered but not yet delivered. -Continue with plan for CPAP machine for potential sleep apnea. -Consider re-evaluating Trazodone dosage if sleep does not improve with CPAP use.  Premature Atrial Contractions (PVCs) Reports feeling pulse more prominently, especially when still. EKG shows occasional PVCs. -No immediate intervention -Consider trial of CCB.  Memory Concerns Reports recent issues with forgetfulness and processing. -Order vitamin B levels as deficiencies can contribute to memory issues.  Leg Cramps Reports occasional cramping in calves. -Check electrolyte levels as part of comprehensive metabolic panel as imbalances can cause muscle cramps.  Hypertension Blood pressure initially high but normalized on recheck. -Continue current management and monitor blood pressure regularly.  Follow-up Await lab results, expected within the next two days. Plan for follow-up communication by Wednesday, January 06, 2023.    No follow-ups on file.      Mila Merry, MD  [facility] [phone] (phone) [phone] (fax)  [facility]

## 2023-01-06 ENCOUNTER — Telehealth: Payer: Self-pay | Admitting: Family Medicine

## 2023-01-06 NOTE — Telephone Encounter (Signed)
Pt called in for lab results. Please cb when in

## 2023-01-07 NOTE — Telephone Encounter (Signed)
Labs drawn Monday were all normal. Please see if [facility] can ADD BNP (b-type natriuretic protein) and CRP (total c-reactive protein)

## 2023-01-08 NOTE — Telephone Encounter (Signed)
[facility] [phone]

## 2023-01-08 NOTE — Telephone Encounter (Signed)
I spoke with a representative at [facility] requesting the tests to be added. She states they will either fax [place] a form to complete or send [place] notice that they do not have enough specimen.

## 2023-01-14 LAB — BASIC METABOLIC PANEL
BUN/Creatinine Ratio: 13 (ref 10–24)
BUN: 16 mg/dL (ref 8–27)
CO2: 20 mmol/L (ref 20–29)
Calcium: 9.6 mg/dL (ref 8.6–10.2)
Chloride: 102 mmol/L (ref 96–106)
Creatinine, Ser: 1.19 mg/dL (ref 0.76–1.27)
Glucose: 81 mg/dL (ref 70–99)
Potassium: 4.2 mmol/L (ref 3.5–5.2)
Sodium: 142 mmol/L (ref 134–144)
eGFR: 65 mL/min/{1.73_m2} (ref >59)

## 2023-01-14 LAB — C-REACTIVE PROTEIN: CRP: <1 mg/L (ref 0–10)

## 2023-01-14 LAB — SPECIMEN STATUS REPORT

## 2023-03-15 ENCOUNTER — Ambulatory Visit (INDEPENDENT_AMBULATORY_CARE_PROVIDER_SITE_OTHER): Payer: Medicare Other | Admitting: Family Medicine

## 2023-03-15 VITALS — BP 139/77 | HR 69 | Temp 98.2°F | Resp 16 | Wt 205.0 lb

## 2023-03-15 DIAGNOSIS — G4733 Obstructive sleep apnea (adult) (pediatric): Secondary | ICD-10-CM

## 2023-03-15 NOTE — Patient Instructions (Signed)
 Marland Kitchen  Please review the attached list of medications and notify my office if there are any errors.   . Please bring all of your medications to every appointment so we can make sure that our medication list is the same as yours.

## 2023-03-18 ENCOUNTER — Other Ambulatory Visit: Payer: Self-pay | Admitting: Family Medicine

## 2023-03-19 NOTE — Telephone Encounter (Signed)
 Requested Prescriptions  Pending Prescriptions Disp Refills   traZODone (DESYREL) 150 MG tablet [Pharmacy Med Name: traZODone HCl 150 MG Oral Tablet] 90 tablet 1    Sig: TAKE 1/2 TO 1 (ONE-HALF TO ONE) TABLET BY MOUTH AT BEDTIME     Psychiatry: Antidepressants - Serotonin Modulator Passed - 03/19/2023  4:03 PM      Passed - Valid encounter within last 6 months    Recent Outpatient Visits           2 months ago Palpitations   [place] [facility] Malva Limes, MD   2 months ago OSA on CPAP   [facility] [place] [facility] Malva Limes, MD   7 months ago Pure hyperglyceridemia   [place] [facility] Malva Limes, MD   11 months ago Biceps muscle tear, right, sequela   [facility] Jacky Kindle, FNP   1 year ago Pure hyperglyceridemia   [facility] Malva Limes, MD       Future Appointments             In 4 months Fisher, Demetrios Isaacs, MD [facility]

## 2023-03-22 NOTE — Progress Notes (Signed)
 Established patient visit   Patient: Earl Crawford   DOB: 06-04-1950   73 y.o. Male  MRN: [id number] Visit Date: 03/15/2023  Today's healthcare provider: Mila Merry, MD   Chief Complaint  Patient presents with   Obstructive Sleep Apnea    Patient received new CPAP machine 01/08/23 and is here for follow up.  He states he is using it nightly and feels he does benefit from it's use.     Subjective    Discussed the use of AI scribe software for clinical note transcription with the patient, who gave verbal consent to proceed.  History of Present Illness   Earl Crawford "[name]" is a 73 year old male who presents for follow-up obstructive sleep apnea after receiving a new CPAP machine. He has been on CPAP machines for about twenty years and recently replaced his old machine due to its age.  He uses the CPAP machine every night, typically from 10 PM until around 5 AM, often waking up around 4:40 or 5 AM feeling wide awake, at which point he sometimes removes the machine. He ensures a minimum of four hours of use each night. The fit and seal of the headgear can affect his comfort and usage duration.  He mentions that people have noticed his weight loss and inquires about his lab results. His appetite is good, and he is eating well. He has cut out soft drinks from his diet, although he still occasionally indulges in ice cream. His current weight is 205 pounds, which he believes is within a healthy range for his height. He recalls being 225 pounds previously and acknowledges that his medical records list obesity as a condition, although he feels he is not obese.       Medications: Outpatient Medications Prior to Visit  Medication Sig   atorvastatin (LIPITOR) 20 MG tablet Take 1 tablet by mouth once daily   cetirizine (ZYRTEC) 10 MG tablet Take 1 tablet by mouth once daily   Cholecalciferol (VITAMIN D3) 5000 units CAPS Take 1 capsule by mouth daily.   Multiple Vitamins-Minerals  (MULTIVITAMIN ADULT PO) Take 1 tablet by mouth daily.   Omega-3 Fatty Acids (FISH OIL) 1200 MG CAPS Take 1 capsule by mouth daily.   PARoxetine (PAXIL) 10 MG tablet Take 1 tablet by mouth once daily   tadalafil (CIALIS) 5 MG tablet TAKE 1 TABLET BY MOUTH EVERY OTHER DAY AS NEEDED FOR ERECTILE DYSFUNCTION   [DISCONTINUED] traZODone (DESYREL) 150 MG tablet TAKE 1/2 TO 1 (ONE-HALF TO ONE) TABLET BY MOUTH AT BEDTIME   No facility-administered medications prior to visit.   Review of Systems  Constitutional:  Positive for unexpected weight change. Negative for appetite change, chills and fever.  Respiratory:  Negative for chest tightness, shortness of breath and wheezing.   Cardiovascular:  Negative for chest pain and palpitations.  Gastrointestinal:  Negative for abdominal pain, nausea and vomiting.       Objective    BP 139/77 (BP Location: Left Arm, Patient Position: Sitting, Cuff Size: Normal)   Pulse 69   Temp 98.2 F (36.8 C) (Oral)   Resp 16   Wt 205 lb (93 kg)   BMI 29.41 kg/m   Physical Exam  General appearance: Well developed, well nourished male, cooperative and in no acute distress Head: Normocephalic, without obvious abnormality, atraumatic Respiratory: Respirations even and unlabored, normal respiratory rate Extremities: All extremities are intact.  Skin: Skin color, texture, turgor normal. No rashes seen  Psych:  Appropriate mood and affect. Neurologic: Mental status: Alert, oriented to person, place, and time, thought content appropriate.   Assessment & Plan        Obstructive Sleep Apnea Recently transitioned to a new CPAP machine with auto titration. Reports consistent use and compliance with the device. Is medically benefitting from its use.  -Continue current CPAP use. -Send clinical notes to [facility] as requested.  Unintentional Weight Loss Patient reports unintentional weight loss, which has been noted by others. However, he has eliminated sweetened drinks  from his diet, recent labs were normal, and patient reports good appetite and dietary habits. Weight is currently within a healthy range. -Continue to monitor weight. -Schedule annual physical exam and routine labs for end of July 2025.    Return in about 5 months (around 08/12/2023) for Hypertension.      Mila Merry, MD  [facility] [phone] (phone) [phone] (fax)  [facility]

## 2023-03-24 ENCOUNTER — Ambulatory Visit (INDEPENDENT_AMBULATORY_CARE_PROVIDER_SITE_OTHER): Payer: Medicare Other | Admitting: Emergency Medicine

## 2023-03-24 VITALS — Ht 69.5 in | Wt 205.0 lb

## 2023-03-24 DIAGNOSIS — Z0000 Encounter for general adult medical examination without abnormal findings: Secondary | ICD-10-CM | POA: Diagnosis not present

## 2023-03-24 NOTE — Progress Notes (Signed)
 Subjective:   Earl Crawford is a 73 y.o. who presents for a Medicare Wellness preventive visit.  Visit Complete: Virtual I connected with  Marjean Donna on 03/24/23 by a audio enabled telemedicine application and verified that I am speaking with the correct person using two identifiers.  Patient Location: Home  Provider Location: Home Office  I discussed the limitations of evaluation and management by telemedicine. The patient expressed understanding and agreed to proceed.  Vital Signs: Because this visit was a virtual/telehealth visit, some criteria may be missing or patient reported. Any vitals not documented were not able to be obtained and vitals that have been documented are patient reported.  VideoDeclined- This patient declined Librarian, academic. Therefore the visit was completed with audio only.  AWV Questionnaire: Yes: Patient Medicare AWV questionnaire was completed by the patient on 03/22/23; I have confirmed that all information answered by patient is correct and no changes since this date.  Cardiac Risk Factors include: advanced age (>44men, >66 women);dyslipidemia;male gender;Other (see comment), Risk factor comments: OSA (cpap)     Objective:    Today's Vitals   03/24/23 1045  Weight: 205 lb (93 kg)  Height: 5' 9.5" (1.765 m)   Body mass index is 29.84 kg/m.     03/24/2023   11:08 AM 03/23/2022   11:22 AM 03/19/2021    1:17 PM 03/06/2020   10:36 AM 12/28/2018    8:36 AM 10/25/2017    9:32 AM 07/06/2016    8:01 AM  Advanced Directives  Does Patient Have a Medical Advance Directive? Yes Yes Yes Yes Yes Yes Yes  Type of Estate agent of [place];Living will  Healthcare Power of [place];Living will Healthcare Power of [place];Living will Healthcare Power of [place];Living will Healthcare Power of [place];Living will Healthcare Power of [place];Living will  Does patient want to make changes to medical advance  directive? No - Patient declined  Yes (Inpatient - patient defers changing a medical advance directive and declines information at this time)    No - Patient declined  Copy of Healthcare Power of Attorney in Chart? Yes - validated most recent copy scanned in chart (See row information)  Yes - validated most recent copy scanned in chart (See row information) No - copy requested No - copy requested No - copy requested No - copy requested    Current Medications (verified) Outpatient Encounter Medications as of 03/24/2023  Medication Sig   atorvastatin (LIPITOR) 20 MG tablet Take 1 tablet by mouth once daily   cetirizine (ZYRTEC) 10 MG tablet Take 1 tablet by mouth once daily   Cholecalciferol (VITAMIN D3) 5000 units CAPS Take 1 capsule by mouth daily.   Multiple Vitamins-Minerals (MULTIVITAMIN ADULT PO) Take 1 tablet by mouth daily.   Omega-3 Fatty Acids (FISH OIL) 1200 MG CAPS Take 1 capsule by mouth daily.   PARoxetine (PAXIL) 10 MG tablet Take 1 tablet by mouth once daily   tadalafil (CIALIS) 5 MG tablet TAKE 1 TABLET BY MOUTH EVERY OTHER DAY AS NEEDED FOR ERECTILE DYSFUNCTION   traZODone (DESYREL) 150 MG tablet TAKE 1/2 TO 1 (ONE-HALF TO ONE) TABLET BY MOUTH AT BEDTIME   No facility-administered encounter medications on file as of 03/24/2023.    Allergies (verified) Patient has no known allergies.   History: Past Medical History:  Diagnosis Date   Asthma    as child   Benign neoplasm of transverse colon    Bilateral impacted cerumen 11/02/2016   Boutonniere deformity  of finger    History of measles    History of mumps    Hyperlipidemia    Neuromuscular disorder (HCC) Sept. 2021   Pain in left shoulder / arm   Sleep apnea    not currently using CPAP "too noisy"   Tinnitus    Past Surgical History:  Procedure Laterality Date   COLONOSCOPY     COLONOSCOPY WITH PROPOFOL N/A 07/06/2016   Procedure: COLONOSCOPY WITH PROPOFOL;  Surgeon: Midge Minium, MD;  Location: [facility];  Service: Endoscopy;  Laterality: N/A;  sleep apnea   HERNIA REPAIR  1970   herniorrhapy   POLYPECTOMY  07/06/2016   Procedure: POLYPECTOMY;  Surgeon: Midge Minium, MD;  Location: [facility];  Service: Endoscopy;;   Family History  Problem Relation Age of Onset   Macular degeneration Mother    Memory loss Mother    Heart attack Father    Hypertension Father    Obesity Father    Heart disease Father    Arthritis Father    Early death Father    Diabetes Other    Cancer Neg Hx    Social History   Socioeconomic History   Marital status: Married    Spouse name: Bonita Quin "Lindy"   Number of children: 2   Years of education: Not on file   Highest education level: Bachelor's degree (e.g., BA, AB, BS)  Occupational History   Occupation: Engineer, technical sales    Comment: Retired 2019  Tobacco Use   Smoking status: Former    Current packs/day: 0.00    Average packs/day: 0.5 packs/day for 3.0 years (1.5 ttl pk-yrs)    Types: Cigarettes    Start date: 36    Quit date: 01/20/1974    Years since quitting: 49.2   Smokeless tobacco: Never   Tobacco comments:    N/A  Vaping Use   Vaping status: Never Used  Substance and Sexual Activity   Alcohol use: Not Currently    Comment: 1 cocktail monthly or less   Drug use: No   Sexual activity: Yes    Birth control/protection: Surgical    Comment: Vascectomy  Other Topics Concern   Not on file  Social History Narrative   2 biological children, 2 step-children   Social Drivers of Health   Financial Resource Strain: Low Risk  (03/24/2023)   Overall Financial Resource Strain (CARDIA)    Difficulty of Paying Living Expenses: Not hard at all  Food Insecurity: No Food Insecurity (03/24/2023)   Hunger Vital Sign    Worried About Running Out of Food in the Last Year: Never true    Ran Out of Food in the Last Year: Never true  Transportation Needs: No Transportation Needs (03/24/2023)   PRAPARE - Scientist, research (physical sciences) (Medical): No    Lack of Transportation (Non-Medical): No  Physical Activity: Insufficiently Active (03/24/2023)   Exercise Vital Sign    Days of Exercise per Week: 1 day    Minutes of Exercise per Session: 40 min  Stress: No Stress Concern Present (03/24/2023)   [employer] of Occupational Health - Occupational Stress Questionnaire    Feeling of Stress : Only a little  Recent Concern: Stress - Stress Concern Present (01/04/2023)   [employer] of Occupational Health - Occupational Stress Questionnaire    Feeling of Stress : To some extent  Social Connections: Socially Integrated (03/24/2023)   Social Connection and Isolation Panel [NHANES]    Frequency of Communication  with Friends and Family: More than three times a week    Frequency of Social Gatherings with Friends and Family: Three times a week    Attends Religious Services: More than 4 times per year    Active Member of Clubs or Organizations: Yes    Attends Engineer, structural: More than 4 times per year    Marital Status: Married    Tobacco Counseling Counseling given: Not Answered Tobacco comments: N/A    Clinical Intake:  Pre-visit preparation completed: Yes  Pain : No/denies pain     BMI - recorded: 29.84 Nutritional Status: BMI 25 -29 Overweight Nutritional Risks: None Diabetes: No  How often do you need to have someone help you when you read instructions, pamphlets, or other written materials from your doctor or pharmacy?: 1 - Never  Interpreter Needed?: No  Information entered by :: Tora Kindred, CMA   Activities of Daily Living     03/24/2023   10:51 AM 03/22/2023   12:45 PM  In your present state of health, do you have any difficulty performing the following activities:  Hearing? 1 0  Comment wears hearing aids   Vision? 0 0  Difficulty concentrating or making decisions? 0 1  Walking or climbing stairs? 0 0  Dressing or bathing? 0 0  Doing errands, shopping? 0 0   Preparing Food and eating ? N N  Using the Toilet? N N  In the past six months, have you accidently leaked urine? N N  Do you have problems with loss of bowel control? N N  Managing your Medications? N N  Managing your Finances? N N  Housekeeping or managing your Housekeeping? N N    Patient Care Team: Malva Limes, MD as PCP - General (Family Medicine) Midge Minium, MD as Consulting Physician (Gastroenterology) [facility], [facility]  Indicate any recent Medical Services you may have received from other than [facility] providers in the past year (date may be approximate).     Assessment:   This is a routine wellness examination for [facility].  Hearing/Vision screen Hearing Screening - Comments:: Wears hearing loss Vision Screening - Comments:: Gets eye exams, [facility], [place]   Goals Addressed               This Visit's Progress     Exercise 3x per week (30 min per time) (pt-stated)         Depression Screen     03/24/2023   11:05 AM 01/04/2023    1:17 PM 01/04/2023    1:16 PM 08/17/2022    9:54 AM 04/22/2022    1:27 PM 03/23/2022   11:20 AM 03/19/2021    1:14 PM  PHQ 2/9 Scores  PHQ - 2 Score 0 0  0 0 0 0  PHQ- 9 Score 1 2   1     Exception Documentation   Patient refusal        Fall Risk     03/24/2023   11:10 AM 03/22/2023   12:45 PM 08/17/2022    9:54 AM 04/22/2022    1:27 PM 03/20/2022    2:29 PM  Fall Risk   Falls in the past year? 1 1 0 1 1  Number falls in past yr: 0 0  1 0  Injury with Fall? 1 0 0 0 1  Risk for fall due to : History of fall(s);Impaired balance/gait;Orthopedic patient  No Fall Risks History of fall(s)   Follow up Falls prevention  discussed;Falls evaluation completed  Falls evaluation completed Falls evaluation completed     MEDICARE RISK AT HOME:  Medicare Risk at Home Any stairs in or around the home?: Yes If so, are there any without handrails?: No Home free of loose throw rugs in walkways, pet beds, electrical cords, etc?:  No Adequate lighting in your home to reduce risk of falls?: Yes Life alert?: No Use of a cane, walker or w/c?: No Grab bars in the bathroom?: No Shower chair or bench in shower?: Yes Elevated toilet seat or a handicapped toilet?: No  TIMED UP AND GO:  Was the test performed?  No  Cognitive Function: 6CIT completed        03/24/2023   11:12 AM 03/23/2022   11:25 AM 10/25/2017    9:38 AM  6CIT Screen  What Year? 0 points 0 points 0 points  What month? 0 points 0 points 0 points  What time? 0 points 0 points 0 points  Count back from 20 0 points 0 points 0 points  Months in reverse 0 points 0 points 0 points  Repeat phrase 0 points 0 points 0 points  Total Score 0 points 0 points 0 points    Immunizations Immunization History  Administered Date(s) Administered   Fluad Trivalent(High Dose 65+) 12/25/2022   H1N1 11/24/2007   Influenza Split 12/04/2006, 02/08/2009, 12/16/2009, 11/19/2010   Influenza, High Dose Seasonal PF 11/02/2016, 10/25/2017, 09/30/2020   Influenza-Unspecified 11/17/2014, 10/03/2019   Moderna Sars-Covid-2 Vaccination 02/26/2019, 03/29/2019, 11/15/2019   Pneumococcal Conjugate-13 02/19/2015   Pneumococcal Polysaccharide-23 05/18/2016   RSV,unspecified 01/23/2022   Td 01/23/2000, 06/18/2020   Tdap 05/04/2008   Zoster Recombinant(Shingrix) 06/18/2020   Zoster, Live 02/27/2010    Screening Tests Health Maintenance  Topic Date Due   Zoster Vaccines- Shingrix (2 of 2) 08/13/2020   COVID-19 Vaccine (4 - 2024-25 season) 09/20/2022   Fecal DNA (Cologuard)  02/11/2024   Medicare Annual Wellness (AWV)  03/23/2024   DTaP/Tdap/Td (4 - Td or Tdap) 06/19/2030   Pneumonia Vaccine 35+ Years old  Completed   INFLUENZA VACCINE  Completed   Hepatitis C Screening  Completed   HPV VACCINES  Aged Out   Colonoscopy  Discontinued    Health Maintenance  Health Maintenance Due  Topic Date Due   Zoster Vaccines- Shingrix (2 of 2) 08/13/2020   COVID-19 Vaccine (4 -  2024-25 season) 09/20/2022   Health Maintenance Items Addressed: See Nurse Notes  Additional Screening:  Vision Screening: Recommended annual ophthalmology exams for early detection of glaucoma and other disorders of the eye.  Dental Screening: Recommended annual dental exams for proper oral hygiene  Community Resource Referral / Chronic Care Management: CRR required this visit?  No   CCM required this visit?  No     Plan:     I have personally reviewed and noted the following in the patient's chart:   Medical and social history Use of alcohol, tobacco or illicit drugs  Current medications and supplements including opioid prescriptions. Patient is not currently taking opioid prescriptions. Functional ability and status Nutritional status Physical activity Advanced directives List of other physicians Hospitalizations, surgeries, and ER visits in previous 12 months Vitals Screenings to include cognitive, depression, and falls Referrals and appointments  In addition, I have reviewed and discussed with patient certain preventive protocols, quality metrics, and best practice recommendations. A written personalized care plan for preventive services as well as general preventive health recommendations were provided to patient.     Gunnar Fusi  [place], [facility]   03/24/2023   After Visit Summary: (MyChart) Due to this being a telephonic visit, the after visit summary with patients personalized plan was offered to patient via MyChart   Notes:  Declined Covid vaccine Patient states he is UTD on Shingrix vaccine

## 2023-03-24 NOTE — Patient Instructions (Addendum)
 Earl Crawford , Thank you for taking time to come for your Medicare Wellness Visit. I appreciate your ongoing commitment to your health goals. Please review the following plan we discussed and let me know if I can assist you in the future.   Referrals/Orders/Follow-Ups/Clinician Recommendations: Keep up the good work!! Try to exercise more routinely.  This is a list of the screening recommended for you and due dates:  Health Maintenance  Topic Date Due   Zoster (Shingles) Vaccine (2 of 2) 08/13/2020   COVID-19 Vaccine (4 - 2024-25 season) 09/20/2022   Cologuard (Stool DNA test)  02/11/2024   Medicare Annual Wellness Visit  03/23/2024   DTaP/Tdap/Td vaccine (4 - Td or Tdap) 06/19/2030   Pneumonia Vaccine  Completed   Flu Shot  Completed   Hepatitis C Screening  Completed   HPV Vaccine  Aged Out   Colon Cancer Screening  Discontinued    Advanced directives: (In Chart) A copy of your advanced directives are scanned into your chart should your provider ever need it.  Next Medicare Annual Wellness Visit scheduled for next year: Yes, 03/30/23 @ 10:50am (phone visit)  Fall Prevention in the Home, Adult Falls can cause injuries and affect people of all ages. There are many simple things that you can do to make your home safe and to help prevent falls. If you need it, ask for help making these changes. What actions can I take to prevent falls? General information Use good lighting in all rooms. Make sure to: Replace any light bulbs that burn out. Turn on lights if it is dark and use night-lights. Keep items that you use often in easy-to-reach places. Lower the shelves around your home if needed. Move furniture so that there are clear paths around it. Do not keep throw rugs or other things on the floor that can make you trip. If any of your floors are uneven, fix them. Add color or contrast paint or tape to clearly mark and help you see: Grab bars or handrails. First and last steps of  staircases. Where the edge of each step is. If you use a ladder or stepladder: Make sure that it is fully opened. Do not climb a closed ladder. Make sure the sides of the ladder are locked in place. Have someone hold the ladder while you use it. Know where your pets are as you move through your home. What can I do in the bathroom?     Keep the floor dry. Clean up any water that is on the floor right away. Remove soap buildup in the bathtub or shower. Buildup makes bathtubs and showers slippery. Use non-skid mats or decals on the floor of the bathtub or shower. Attach bath mats securely with double-sided, non-slip rug tape. If you need to sit down while you are in the shower, use a non-slip stool. Install grab bars by the toilet and in the bathtub and shower. Do not use towel bars as grab bars. What can I do in the bedroom? Make sure that you have a light by your bed that is easy to reach. Do not use any sheets or blankets on your bed that hang to the floor. Have a firm bench or chair with side arms that you can use for support when you get dressed. What can I do in the kitchen? Clean up any spills right away. If you need to reach something above you, use a sturdy step stool that has a grab bar. Keep electrical cables  out of the way. Do not use floor polish or wax that makes floors slippery. What can I do with my stairs? Do not leave anything on the stairs. Make sure that you have a light switch at the top and the bottom of the stairs. Have them installed if you do not have them. Make sure that there are handrails on both sides of the stairs. Fix handrails that are broken or loose. Make sure that handrails are as long as the staircases. Install non-slip stair treads on all stairs in your home if they do not have carpet. Avoid having throw rugs at the top or bottom of stairs, or secure the rugs with carpet tape to prevent them from moving. Choose a carpet design that does not hide the  edge of steps on the stairs. Make sure that carpet is firmly attached to the stairs. Fix any carpet that is loose or worn. What can I do on the outside of my home? Use bright outdoor lighting. Repair the edges of walkways and driveways and fix any cracks. Clear paths of anything that can make you trip, such as tools or rocks. Add color or contrast paint or tape to clearly mark and help you see high doorway thresholds. Trim any bushes or trees on the main path into your home. Check that handrails are securely fastened and in good repair. Both sides of all steps should have handrails. Install guardrails along the edges of any raised decks or porches. Have leaves, snow, and ice cleared regularly. Use sand, salt, or ice melt on walkways during winter months if you live where there is ice and snow. In the garage, clean up any spills right away, including grease or oil spills. What other actions can I take? Review your medicines with your health care provider. Some medicines can make you confused or feel dizzy. This can increase your chance of falling. Wear closed-toe shoes that fit well and support your feet. Wear shoes that have rubber soles and low heels. Use a cane, walker, scooter, or crutches that help you move around if needed. Talk with your provider about other ways that you can decrease your risk of falls. This may include seeing a physical therapist to learn to do exercises to improve movement and strength. Where to find more information Centers for Disease Control and Prevention, [employer]AD[employer]: TonerPromos.no [employer] on Aging: BaseRingTones.pl National Institute on Aging: BaseRingTones.pl Contact a health care provider if: You are afraid of falling at home. You feel weak, drowsy, or dizzy at home. You fall at home. Get help right away if you: Lose consciousness or have trouble moving after a fall. Have a fall that causes a head injury. These symptoms may be an emergency. Get help right away. Call  911. Do not wait to see if the symptoms will go away. Do not drive yourself to the hospital. This information is not intended to replace advice given to you by your health care provider. Make sure you discuss any questions you have with your health care provider. Document Revised: 09/08/2021 Document Reviewed: 09/08/2021 Elsevier Patient Education  2024 [employer].

## 2023-03-25 ENCOUNTER — Telehealth: Payer: Self-pay

## 2023-03-25 NOTE — Telephone Encounter (Signed)
 Office notes printed and faxed to [place]. Fax number is [phone] [facility]. Got number from the orders that were signed and faxed back 02/02/2023.  Left message on patient's voicemail letting him know notes were faxed.

## 2023-03-25 NOTE — Telephone Encounter (Signed)
 Copied from CRM [id number]. Topic: General - Other >> Mar 23, 2023 10:34 AM Carlatta H wrote: Reason for CRM: Patient would like to know when notes will be sent to [facility]

## 2023-03-29 DIAGNOSIS — D2272 Melanocytic nevi of left lower limb, including hip: Secondary | ICD-10-CM | POA: Diagnosis not present

## 2023-03-29 DIAGNOSIS — D2271 Melanocytic nevi of right lower limb, including hip: Secondary | ICD-10-CM | POA: Diagnosis not present

## 2023-03-29 DIAGNOSIS — D2261 Melanocytic nevi of right upper limb, including shoulder: Secondary | ICD-10-CM | POA: Diagnosis not present

## 2023-03-29 DIAGNOSIS — L249 Irritant contact dermatitis, unspecified cause: Secondary | ICD-10-CM | POA: Diagnosis not present

## 2023-03-29 DIAGNOSIS — L821 Other seborrheic keratosis: Secondary | ICD-10-CM | POA: Diagnosis not present

## 2023-03-29 DIAGNOSIS — D225 Melanocytic nevi of trunk: Secondary | ICD-10-CM | POA: Diagnosis not present

## 2023-03-29 DIAGNOSIS — D2262 Melanocytic nevi of left upper limb, including shoulder: Secondary | ICD-10-CM | POA: Diagnosis not present

## 2023-04-08 ENCOUNTER — Other Ambulatory Visit: Payer: Self-pay | Admitting: Family Medicine

## 2023-04-08 DIAGNOSIS — N529 Male erectile dysfunction, unspecified: Secondary | ICD-10-CM

## 2023-06-10 DIAGNOSIS — H903 Sensorineural hearing loss, bilateral: Secondary | ICD-10-CM | POA: Diagnosis not present

## 2023-06-10 DIAGNOSIS — H6123 Impacted cerumen, bilateral: Secondary | ICD-10-CM | POA: Diagnosis not present

## 2023-08-13 ENCOUNTER — Encounter: Payer: Self-pay | Admitting: Family Medicine

## 2023-08-13 ENCOUNTER — Ambulatory Visit (INDEPENDENT_AMBULATORY_CARE_PROVIDER_SITE_OTHER): Payer: 59 | Admitting: Family Medicine

## 2023-08-13 VITALS — BP 124/83 | HR 76 | Resp 16 | Ht 69.0 in | Wt 192.7 lb

## 2023-08-13 DIAGNOSIS — F419 Anxiety disorder, unspecified: Secondary | ICD-10-CM | POA: Diagnosis not present

## 2023-08-13 DIAGNOSIS — Z125 Encounter for screening for malignant neoplasm of prostate: Secondary | ICD-10-CM

## 2023-08-13 DIAGNOSIS — G4733 Obstructive sleep apnea (adult) (pediatric): Secondary | ICD-10-CM

## 2023-08-13 DIAGNOSIS — R413 Other amnesia: Secondary | ICD-10-CM

## 2023-08-13 DIAGNOSIS — R410 Disorientation, unspecified: Secondary | ICD-10-CM

## 2023-08-13 DIAGNOSIS — F0390 Unspecified dementia without behavioral disturbance: Secondary | ICD-10-CM

## 2023-08-13 DIAGNOSIS — Z974 Presence of external hearing-aid: Secondary | ICD-10-CM

## 2023-08-13 DIAGNOSIS — R634 Abnormal weight loss: Secondary | ICD-10-CM | POA: Diagnosis not present

## 2023-08-13 DIAGNOSIS — Z6830 Body mass index (BMI) 30.0-30.9, adult: Secondary | ICD-10-CM | POA: Diagnosis not present

## 2023-08-13 DIAGNOSIS — E781 Pure hyperglyceridemia: Secondary | ICD-10-CM

## 2023-08-13 NOTE — Patient Instructions (Addendum)
 Please review the attached list of medications and notify my office if there are any errors.   Stop the atorvastatin  for the time being

## 2023-08-13 NOTE — Progress Notes (Signed)
 Established patient visit   Patient: Earl Crawford   DOB: Dec 13, 1950   73 y.o. Male  MRN: [id number] Visit Date: 08/13/2023  Today's healthcare provider: Nancyann Perry, MD    Subjective    HPI  Patient presents for follow up anxiety and hyperlipidemia, reporting continued problems with his memory and weight loss. His wife accompanies the patient today and reports patient has very poor short term memory and increasingly having poor confidence in his thinking and some ADLs such as driving and worrying about getting lost or confused. He has no problems with long term memory.  He reports his mother is in her 1s and has advanced dementia. No other neurologic symptoms such as numbness, weakness, or visual changes. Has also been steadily losing weight over the last 2-3 years despite having a good appetite and having no other apparent GI symptoms, specifically no nausea, vomiting, bowel changes or abdominal pains. His last colon cancer screening was a negative Cologuard about 2 1/2 years ago. He had extensive labs in December including thyroid  and vitamin B12 levels which were negative.   Wt Readings from Last 10 Encounters:  08/13/23 192 lb 11.2 oz (87.4 kg)  03/24/23 205 lb (93 kg)  03/15/23 205 lb (93 kg)  01/04/23 201 lb 9.6 oz (91.4 kg)  08/17/22 207 lb 12.8 oz (94.3 kg)  04/22/22 217 lb (98.4 kg)  03/23/22 217 lb 14.4 oz (98.8 kg)  07/04/21 219 lb 11.2 oz (99.7 kg)  03/19/21 228 lb 8 oz (103.6 kg)  04/01/20 236 lb (107 kg)     Lab Results  Component Value Date   NA 144 01/04/2023   NA 142 01/04/2023   K 4.1 01/04/2023   K 4.2 01/04/2023   CREATININE 1.16 01/04/2023   CREATININE 1.19 01/04/2023   EGFR 67 01/04/2023   EGFR 65 01/04/2023   GLUCOSE 85 01/04/2023   GLUCOSE 81 01/04/2023   Lab Results  Component Value Date   VITAMINB12 688 01/04/2023   Lab Results  Component Value Date   WBC 6.1 01/04/2023   HGB 16.0 01/04/2023   HCT 46.8 01/04/2023   MCV 95  01/04/2023   PLT 177 01/04/2023      Medications: Outpatient Medications Prior to Visit  Medication Sig   atorvastatin  (LIPITOR) 20 MG tablet Take 1 tablet by mouth once daily   Cholecalciferol (VITAMIN D3) 5000 units CAPS Take 1 capsule by mouth daily.   Multiple Vitamins-Minerals (MULTIVITAMIN ADULT PO) Take 1 tablet by mouth daily.   Omega-3 Fatty Acids (FISH OIL) 1200 MG CAPS Take 1 capsule by mouth daily.   PARoxetine  (PAXIL ) 10 MG tablet Take 1 tablet by mouth once daily   tadalafil  (CIALIS ) 5 MG tablet TAKE 1 TABLET BY MOUTH EVERY OTHER DAY AS NEEDED   traZODone (DESYREL) 150 MG tablet TAKE 1/2 TO 1 (ONE-HALF TO ONE) TABLET BY MOUTH AT BEDTIME   [DISCONTINUED] cetirizine  (ZYRTEC ) 10 MG tablet Take 1 tablet by mouth once daily   No facility-administered medications prior to visit.    Review of Systems     Objective    BP 124/83 (BP Location: Right Arm, Patient Position: Sitting, Cuff Size: Normal)   Pulse 76   Resp 16   Ht 5' 9 (1.753 m)   Wt 192 lb 11.2 oz (87.4 kg)   SpO2 98%   BMI 28.46 kg/m    Physical Exam   General Appearance:    Well developed, well nourished male. Alert, cooperative, in  no acute distress, appears stated age  Head:    Normocephalic, without obvious abnormality, atraumatic  Eyes:    PERRL, conjunctiva/corneas clear, EOM's intact, fundi    benign, both eyes       Ears:    Normal TM's and external ear canals, both ears  Nose:   Nares normal, septum midline, mucosa normal, no drainage   or sinus tenderness  Throat:   Lips, mucosa, and tongue normal; teeth and gums normal  Neck:   Supple, symmetrical, trachea midline, no adenopathy;       thyroid :  No enlargement/tenderness/nodules; no carotid   bruit or JVD  Back:     Symmetric, no curvature, ROM normal, no CVA tenderness  Lungs:     Clear to auscultation bilaterally, respirations unlabored  Chest wall:    No tenderness or deformity  Heart:    Normal heart rate. Normal rhythm. No murmurs,  rubs, or gallops.  S1 and S2 normal  Abdomen:     Soft, non-tender, bowel sounds active all four quadrants,    no masses, no organomegaly  Genitalia:    deferred  Rectal:    deferred  Extremities:   All extremities are intact. No cyanosis or edema  Pulses:   2+ and symmetric all extremities  Skin:   Skin color, texture, turgor normal, no rashes or lesions  Lymph nodes:   Cervical, supraclavicular, and axillary nodes normal  Neurologic:   CNII-XII intact. Normal strength, sensation and reflexes      Throughout. A&O x 3 but cognition a bit delayed.      Assessment & Plan     1. Memory changes (Primary) Progressive over the last 1-2 years out of norm for age.  - MR Brain W Wo Contrast; Future - Ambulatory referral to Neurology  2. Dementia, unspecified dementia severity, unspecified dementia type, unspecified whether behavioral, psychotic, or mood disturbance or anxiety (HCC)  - VITAMIN D 25 Hydroxy (Vit-D Deficiency, Fractures) - MR Brain W Wo Contrast; Future - Ambulatory referral to Neurology  3. Episodic confusion  - VITAMIN D 25 Hydroxy (Vit-D Deficiency, Fractures) - MR Brain W Wo Contrast; Future - Ambulatory referral to Neurology  4. Pure hyperglyceridemia On atorvastatin . Counseled statins due occasional affect CNS and will put hold for the time being.   - CBC - Comprehensive metabolic panel with GFR - Lipid panel  5. OSA on CPAP Reports using consistently every nigh.   6. Anxiety Stable on current dose of paroxetine  which he has been taking for years.   7. Unexplained weight loss Slow steady unintended weight loss over the last 2 year with no apparent change in appetite, diet, and activity levels. Has history colon polyps in 2018 and 2015.  - Ambulatory referral to Gastroenterology - CT ABDOMEN PELVIS W CONTRAST; Future  8. Wears hearing aid in both ears   9. Prostate cancer screening  - PSA Total (Reflex To Free)   Addressed extensive list of chronic  and acute medical problems today requiring 45 minutes reviewing his medical record, counseling patient regarding his conditions and coordination of care.           Nancyann Perry, MD  [facility] [phone] (phone) [phone] (fax)  [facility]

## 2023-08-14 ENCOUNTER — Ambulatory Visit: Payer: Self-pay | Admitting: Family Medicine

## 2023-08-14 LAB — COMPREHENSIVE METABOLIC PANEL WITH GFR
ALT: 33 IU/L (ref 0–44)
AST: 22 IU/L (ref 0–40)
Albumin: 4.6 g/dL (ref 3.8–4.8)
Alkaline Phosphatase: 43 IU/L — ABNORMAL LOW (ref 44–121)
BUN/Creatinine Ratio: 15 (ref 10–24)
BUN: 20 mg/dL (ref 8–27)
Bilirubin Total: 1.3 mg/dL — ABNORMAL HIGH (ref 0.0–1.2)
CO2: 23 mmol/L (ref 20–29)
Calcium: 9.9 mg/dL (ref 8.6–10.2)
Chloride: 104 mmol/L (ref 96–106)
Creatinine, Ser: 1.3 mg/dL — ABNORMAL HIGH (ref 0.76–1.27)
Globulin, Total: 1.9 g/dL (ref 1.5–4.5)
Glucose: 92 mg/dL (ref 70–99)
Potassium: 4.2 mmol/L (ref 3.5–5.2)
Sodium: 143 mmol/L (ref 134–144)
Total Protein: 6.5 g/dL (ref 6.0–8.5)
eGFR: 58 mL/min/1.73 — ABNORMAL LOW (ref >59)

## 2023-08-14 LAB — CBC
Hematocrit: 47.3 % (ref 37.5–51.0)
Hemoglobin: 16 g/dL (ref 13.0–17.7)
MCH: 32.4 pg (ref 26.6–33.0)
MCHC: 33.8 g/dL (ref 31.5–35.7)
MCV: 96 fL (ref 79–97)
Platelets: 156 x10E3/uL (ref 150–450)
RBC: 4.94 x10E6/uL (ref 4.14–5.80)
RDW: 12.8 % (ref 11.6–15.4)
WBC: 6.6 x10E3/uL (ref 3.4–10.8)

## 2023-08-14 LAB — LIPID PANEL
Chol/HDL Ratio: 2.3 ratio (ref 0.0–5.0)
Cholesterol, Total: 117 mg/dL (ref 100–199)
HDL: 51 mg/dL (ref >39)
LDL Chol Calc (NIH): 49 mg/dL (ref 0–99)
Triglycerides: 90 mg/dL (ref 0–149)
VLDL Cholesterol Cal: 17 mg/dL (ref 5–40)

## 2023-08-14 LAB — PSA TOTAL (REFLEX TO FREE): Prostate Specific Ag, Serum: 0.5 ng/mL (ref 0.0–4.0)

## 2023-08-14 LAB — VITAMIN D 25 HYDROXY (VIT D DEFICIENCY, FRACTURES): Vit D, 25-Hydroxy: 57.1 ng/mL (ref 30.0–100.0)

## 2023-08-18 ENCOUNTER — Ambulatory Visit
Admission: RE | Admit: 2023-08-18 | Discharge: 2023-08-18 | Disposition: A | Discharge status: Home / Self Care | Source: Ambulatory Visit | Attending: Family Medicine | Admitting: Family Medicine

## 2023-08-18 DIAGNOSIS — F0390 Unspecified dementia without behavioral disturbance: Secondary | ICD-10-CM | POA: Insufficient documentation

## 2023-08-18 DIAGNOSIS — R413 Other amnesia: Secondary | ICD-10-CM | POA: Diagnosis not present

## 2023-08-18 DIAGNOSIS — G319 Degenerative disease of nervous system, unspecified: Secondary | ICD-10-CM | POA: Diagnosis not present

## 2023-08-18 DIAGNOSIS — R410 Disorientation, unspecified: Secondary | ICD-10-CM | POA: Insufficient documentation

## 2023-08-18 MED ORDER — GADOBUTROL 1 MMOL/ML IV SOLN
8.0000 mL | Freq: Once | INTRAVENOUS | Status: AC | PRN
  Administered 2023-08-18: 8 mL via INTRAVENOUS

## 2023-08-20 ENCOUNTER — Ambulatory Visit
Admission: RE | Admit: 2023-08-20 | Discharge: 2023-08-20 | Disposition: A | Discharge status: Home / Self Care | Source: Ambulatory Visit | Attending: Family Medicine | Admitting: Family Medicine

## 2023-08-20 DIAGNOSIS — R634 Abnormal weight loss: Secondary | ICD-10-CM | POA: Diagnosis not present

## 2023-08-20 DIAGNOSIS — N281 Cyst of kidney, acquired: Secondary | ICD-10-CM | POA: Diagnosis not present

## 2023-08-20 MED ORDER — IOHEXOL 300 MG/ML  SOLN
100.0000 mL | Freq: Once | INTRAMUSCULAR | Status: AC | PRN
  Administered 2023-08-20: 100 mL via INTRAVENOUS

## 2023-09-02 ENCOUNTER — Other Ambulatory Visit: Payer: Self-pay | Admitting: Family Medicine

## 2023-09-02 DIAGNOSIS — F419 Anxiety disorder, unspecified: Secondary | ICD-10-CM

## 2023-09-06 ENCOUNTER — Other Ambulatory Visit: Payer: Self-pay | Admitting: Family Medicine

## 2023-09-06 ENCOUNTER — Encounter: Payer: Self-pay | Admitting: Family Medicine

## 2023-09-17 ENCOUNTER — Encounter: Payer: Self-pay | Admitting: Family Medicine

## 2023-10-04 DIAGNOSIS — R3915 Urgency of urination: Secondary | ICD-10-CM | POA: Diagnosis not present

## 2023-10-04 DIAGNOSIS — Z1331 Encounter for screening for depression: Secondary | ICD-10-CM | POA: Diagnosis not present

## 2023-10-04 DIAGNOSIS — R413 Other amnesia: Secondary | ICD-10-CM | POA: Diagnosis not present

## 2023-10-04 DIAGNOSIS — R634 Abnormal weight loss: Secondary | ICD-10-CM | POA: Diagnosis not present

## 2023-10-04 DIAGNOSIS — F419 Anxiety disorder, unspecified: Secondary | ICD-10-CM | POA: Diagnosis not present

## 2023-10-11 ENCOUNTER — Other Ambulatory Visit: Payer: Self-pay | Admitting: Family Medicine

## 2023-10-11 DIAGNOSIS — N529 Male erectile dysfunction, unspecified: Secondary | ICD-10-CM

## 2023-10-12 ENCOUNTER — Other Ambulatory Visit (HOSPITAL_COMMUNITY): Payer: Self-pay

## 2023-10-14 DIAGNOSIS — L821 Other seborrheic keratosis: Secondary | ICD-10-CM | POA: Diagnosis not present

## 2023-10-14 DIAGNOSIS — D3611 Benign neoplasm of peripheral nerves and autonomic nervous system of face, head, and neck: Secondary | ICD-10-CM | POA: Diagnosis not present

## 2023-11-09 ENCOUNTER — Other Ambulatory Visit: Payer: Self-pay | Admitting: Family Medicine

## 2023-11-18 DIAGNOSIS — Z23 Encounter for immunization: Secondary | ICD-10-CM | POA: Diagnosis not present

## 2023-11-23 DIAGNOSIS — R499 Unspecified voice and resonance disorder: Secondary | ICD-10-CM | POA: Diagnosis not present

## 2023-11-23 DIAGNOSIS — Z860101 Personal history of adenomatous and serrated colon polyps: Secondary | ICD-10-CM | POA: Diagnosis not present

## 2023-11-23 DIAGNOSIS — R634 Abnormal weight loss: Secondary | ICD-10-CM | POA: Diagnosis not present

## 2023-11-30 ENCOUNTER — Other Ambulatory Visit: Payer: Self-pay | Admitting: Family Medicine

## 2023-11-30 DIAGNOSIS — F419 Anxiety disorder, unspecified: Secondary | ICD-10-CM

## 2023-12-03 ENCOUNTER — Other Ambulatory Visit: Payer: Self-pay

## 2023-12-03 ENCOUNTER — Encounter: Payer: Self-pay | Admitting: Gastroenterology

## 2023-12-03 ENCOUNTER — Encounter: Admission: RE | Disposition: A | Payer: Self-pay | Source: Home / Self Care | Attending: Gastroenterology

## 2023-12-03 ENCOUNTER — Ambulatory Visit
Admission: RE | Admit: 2023-12-03 | Discharge: 2023-12-03 | Disposition: A | Discharge status: Home / Self Care | Attending: Gastroenterology | Admitting: Gastroenterology

## 2023-12-03 ENCOUNTER — Ambulatory Visit

## 2023-12-03 DIAGNOSIS — K3189 Other diseases of stomach and duodenum: Secondary | ICD-10-CM | POA: Diagnosis not present

## 2023-12-03 DIAGNOSIS — F419 Anxiety disorder, unspecified: Secondary | ICD-10-CM | POA: Diagnosis not present

## 2023-12-03 DIAGNOSIS — Z87891 Personal history of nicotine dependence: Secondary | ICD-10-CM | POA: Insufficient documentation

## 2023-12-03 DIAGNOSIS — D123 Benign neoplasm of transverse colon: Secondary | ICD-10-CM | POA: Insufficient documentation

## 2023-12-03 DIAGNOSIS — E785 Hyperlipidemia, unspecified: Secondary | ICD-10-CM | POA: Insufficient documentation

## 2023-12-03 DIAGNOSIS — K644 Residual hemorrhoidal skin tags: Secondary | ICD-10-CM | POA: Insufficient documentation

## 2023-12-03 DIAGNOSIS — Z1211 Encounter for screening for malignant neoplasm of colon: Secondary | ICD-10-CM | POA: Diagnosis not present

## 2023-12-03 DIAGNOSIS — G4730 Sleep apnea, unspecified: Secondary | ICD-10-CM | POA: Insufficient documentation

## 2023-12-03 DIAGNOSIS — D122 Benign neoplasm of ascending colon: Secondary | ICD-10-CM | POA: Diagnosis not present

## 2023-12-03 DIAGNOSIS — K648 Other hemorrhoids: Secondary | ICD-10-CM | POA: Insufficient documentation

## 2023-12-03 DIAGNOSIS — K649 Unspecified hemorrhoids: Secondary | ICD-10-CM | POA: Diagnosis not present

## 2023-12-03 DIAGNOSIS — K5790 Diverticulosis of intestine, part unspecified, without perforation or abscess without bleeding: Secondary | ICD-10-CM | POA: Diagnosis not present

## 2023-12-03 DIAGNOSIS — D124 Benign neoplasm of descending colon: Secondary | ICD-10-CM | POA: Diagnosis not present

## 2023-12-03 DIAGNOSIS — K5730 Diverticulosis of large intestine without perforation or abscess without bleeding: Secondary | ICD-10-CM | POA: Insufficient documentation

## 2023-12-03 DIAGNOSIS — Z860101 Personal history of adenomatous and serrated colon polyps: Secondary | ICD-10-CM | POA: Diagnosis not present

## 2023-12-03 DIAGNOSIS — D120 Benign neoplasm of cecum: Secondary | ICD-10-CM | POA: Diagnosis not present

## 2023-12-03 DIAGNOSIS — Z79899 Other long term (current) drug therapy: Secondary | ICD-10-CM | POA: Diagnosis not present

## 2023-12-03 DIAGNOSIS — F0390 Unspecified dementia without behavioral disturbance: Secondary | ICD-10-CM | POA: Diagnosis not present

## 2023-12-03 DIAGNOSIS — Z6826 Body mass index (BMI) 26.0-26.9, adult: Secondary | ICD-10-CM | POA: Insufficient documentation

## 2023-12-03 DIAGNOSIS — J45909 Unspecified asthma, uncomplicated: Secondary | ICD-10-CM | POA: Insufficient documentation

## 2023-12-03 DIAGNOSIS — R634 Abnormal weight loss: Secondary | ICD-10-CM | POA: Insufficient documentation

## 2023-12-03 DIAGNOSIS — K317 Polyp of stomach and duodenum: Secondary | ICD-10-CM | POA: Diagnosis not present

## 2023-12-03 DIAGNOSIS — G4733 Obstructive sleep apnea (adult) (pediatric): Secondary | ICD-10-CM | POA: Diagnosis not present

## 2023-12-03 HISTORY — PX: POLYPECTOMY: SHX149

## 2023-12-03 HISTORY — PX: COLONOSCOPY: SHX5424

## 2023-12-03 HISTORY — PX: ESOPHAGOGASTRODUODENOSCOPY: SHX5428

## 2023-12-03 SURGERY — COLONOSCOPY
Anesthesia: General

## 2023-12-03 MED ORDER — SODIUM CHLORIDE 0.9 % IV SOLN: INTRAVENOUS | Status: DC

## 2023-12-03 MED ORDER — EPHEDRINE SULFATE (PRESSORS) 25 MG/5ML IV SOSY
PREFILLED_SYRINGE | INTRAVENOUS | Status: DC | PRN
  Administered 2023-12-03: 10 mg via INTRAVENOUS

## 2023-12-03 MED ORDER — PROPOFOL 500 MG/50ML IV EMUL
INTRAVENOUS | Status: DC | PRN
  Administered 2023-12-03: 100 mg via INTRAVENOUS
  Administered 2023-12-03: 150 ug/kg/min via INTRAVENOUS

## 2023-12-03 MED ORDER — PHENYLEPHRINE 80 MCG/ML (10ML) SYRINGE FOR IV PUSH (FOR BLOOD PRESSURE SUPPORT)
PREFILLED_SYRINGE | INTRAVENOUS | Status: DC | PRN
Start: 2023-12-03 — End: 2023-12-03
  Administered 2023-12-03: 160 ug via INTRAVENOUS
  Administered 2023-12-03: 80 ug via INTRAVENOUS

## 2023-12-03 MED ORDER — LIDOCAINE HCL (PF) 2 % IJ SOLN
INTRAMUSCULAR | Status: DC | PRN
  Administered 2023-12-03: 100 mg via INTRADERMAL

## 2023-12-03 NOTE — Anesthesia Preprocedure Evaluation (Signed)
 Anesthesia Evaluation  Patient identified by MRN, date of birth, ID band Patient awake    Reviewed: Allergy & Precautions, H&P , NPO status , Patient's Chart, lab work & pertinent test results  Airway Mallampati: II  TM Distance: >3 FB Neck ROM: full    Dental no notable dental hx.    Pulmonary asthma , sleep apnea and Continuous Positive Airway Pressure Ventilation , former smoker   Pulmonary exam normal        Cardiovascular negative cardio ROS Normal cardiovascular exam     Neuro/Psych        Dementia, unspecified dementia severity, -Memory changesnegative neurological ROS     GI/Hepatic negative GI ROS, Neg liver ROS,,,  Endo/Other  negative endocrine ROS    Renal/GU negative Renal ROS  negative genitourinary   Musculoskeletal   Abdominal   Peds  Hematology negative hematology ROS (+)   Anesthesia Other Findings Past Medical History: No date: Asthma     Comment:  as child No date: Benign neoplasm of transverse colon 11/02/2016: Bilateral impacted cerumen 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 2021: Neuromuscular disorder (HCC)     Comment:  Pain in left shoulder / arm No date: Sleep apnea     Comment:  not currently using CPAP too noisy No date: Tinnitus  Past Surgical History: No date: COLONOSCOPY 07/06/2016: COLONOSCOPY WITH PROPOFOL ; N/A     Comment:  Procedure: COLONOSCOPY WITH PROPOFOL ;  Surgeon: Jinny Carmine, MD;  Location: [facility];  Service:               Endoscopy;  Laterality: N/A;  sleep apnea 1970: HERNIA REPAIR     Comment:  herniorrhapy 07/06/2016: POLYPECTOMY     Comment:  Procedure: POLYPECTOMY;  Surgeon: Jinny Carmine, MD;                Location: [facility];  Service: Endoscopy;;     Reproductive/Obstetrics negative OB ROS                               Anesthesia Physical Anesthesia Plan  ASA: 3  Anesthesia Plan: General   Post-op Pain Management:    Induction:   PONV Risk Score and Plan: Propofol  infusion and TIVA  Airway Management Planned:   Additional Equipment:   Intra-op Plan:   Post-operative Plan:   Informed Consent:      Dental Advisory Given  Plan Discussed with: CRNA and Surgeon  Anesthesia Plan Comments:          Anesthesia Quick Evaluation

## 2023-12-03 NOTE — Transfer of Care (Signed)
 Immediate Anesthesia Transfer of Care Note  Patient: Earl Crawford  Procedure(s) Performed: COLONOSCOPY EGD (ESOPHAGOGASTRODUODENOSCOPY) POLYPECTOMY, INTESTINE  Patient Location: Endoscopy Unit  Anesthesia Type:General  Level of Consciousness: sedated  Airway & Oxygen Therapy: Patient Spontanous Breathing  Post-op Assessment: Report given to RN and Post -op Vital signs reviewed and stable  Post vital signs: Reviewed and stable  Last Vitals:  Vitals Value Taken Time  BP 94/55 12/03/23 09:39  Temp 35.7 C 12/03/23 09:39  Pulse 100 12/03/23 09:39  Resp 14 12/03/23 09:39  SpO2 100 % 12/03/23 09:39    Last Pain:  Vitals:   12/03/23 0939  TempSrc: Tympanic  PainSc: Asleep         Complications: There were no known notable events for this encounter.

## 2023-12-03 NOTE — H&P (Signed)
 Earl JONELLE Brooklyn, MD [facility], DHIP [address]  [place], [place] [address]  Main: [phone] Fax:  [phone] Pager: [phone]   Primary Care Physician:  Earl Nancyann BRAVO, MD Primary Gastroenterologist:  Dr. Corinn JONELLE Crawford  Pre-Procedure History & Physical: HPI:  Earl Crawford is a 73 y.o. male is here for an endoscopy and colonoscopy.   Past Medical History:  Diagnosis Date   Asthma    as child   Benign neoplasm of transverse colon    Bilateral impacted cerumen 11/02/2016   Boutonniere deformity of finger    History of measles    History of mumps    Hyperlipidemia    Neuromuscular disorder (HCC) Sept. 2021   Pain in left shoulder / arm   Sleep apnea    Using CPAP   Tinnitus     Past Surgical History:  Procedure Laterality Date   COLONOSCOPY     COLONOSCOPY WITH PROPOFOL  N/A 07/06/2016   Procedure: COLONOSCOPY WITH PROPOFOL ;  Surgeon: Jinny Carmine, MD;  Location: [facility];  Service: Endoscopy;  Laterality: N/A;  sleep apnea   HERNIA REPAIR  1970   herniorrhapy   POLYPECTOMY  07/06/2016   Procedure: POLYPECTOMY;  Surgeon: Jinny Carmine, MD;  Location: [facility];  Service: Endoscopy;;    Prior to Admission medications   Medication Sig Start Date End Date Taking? Authorizing Provider  atorvastatin  (LIPITOR) 20 MG tablet Take 1 tablet by mouth once daily 11/09/23   Earl Nancyann BRAVO, MD  Cholecalciferol (VITAMIN D3) 5000 units CAPS Take 1 capsule by mouth daily.    [provider]  Multiple Vitamins-Minerals (MULTIVITAMIN ADULT PO) Take 1 tablet by mouth daily. 02/02/06   [provider]  Omega-3 Fatty Acids (FISH OIL) 1200 MG CAPS Take 1 capsule by mouth daily.    [provider]  PARoxetine  (PAXIL ) 10 MG tablet Take 1 tablet by mouth once daily 11/30/23   Earl Nancyann BRAVO, MD  tadalafil  (CIALIS ) 5 MG tablet TAKE 1 TABLET BY MOUTH EVERY OTHER DAY AS NEEDED 10/12/23   Earl Nancyann BRAVO, MD  traZODone (DESYREL) 150 MG tablet Take 0.5-1 tablets (75-150 mg total) by mouth at bedtime. 09/06/23   Earl Nancyann BRAVO, MD    Allergies as of 11/23/2023   (No Known Allergies)    Family History  Problem Relation Age of Onset   Macular degeneration Mother    Memory loss Mother    Heart attack Father    Hypertension Father    Obesity Father    Heart disease Father    Arthritis Father    Early death Father    Diabetes Other    Cancer Neg Hx     Social History   Socioeconomic History   Marital status: Married    Spouse name: Earl Crawford   Number of children: 2   Years of education: Not on file   Highest education level: Bachelor's degree (e.g., BA, AB, BS)  Occupational History   Occupation: Engineer, technical sales    Comment: Retired 2019  Tobacco Use   Smoking status: Former    Current packs/day: 0.00    Average packs/day: 0.5 packs/day for 3.0 years (1.5 ttl pk-yrs)    Types: Cigarettes    Start date: 31    Quit date: 01/20/1974    Years since quitting: 49.9   Smokeless tobacco: Never   Tobacco comments:    N/A  Vaping Use   Vaping status: Never Used  Substance and Sexual Activity   Alcohol use: Not Currently    Comment: 1 cocktail monthly or less   Drug use: No   Sexual activity: Yes    Birth control/protection: Surgical    Comment: Vascectomy  Other Topics Concern   Not on file  Social History Narrative   2 biological children, 2 step-children   Social Drivers of Corporate Investment Banker Strain: Low Risk  (11/23/2023)   Received from [facility]   Overall Financial Resource Strain (CARDIA)    Difficulty of Paying Living Expenses: Not hard at all  Food Insecurity: No Food Insecurity (11/23/2023)   [facility]   Hunger Vital Sign    Within the past 12 months, you worried that your food would run out before you got the money to buy more.: Never true    Within the past 12 months, the food you  bought just didn't last and you didn't have money to get more.: Never true  Transportation Needs: No Transportation Needs (11/23/2023)   Received from [facility] - Transportation    In the past 12 months, has lack of transportation kept you from medical appointments or from getting medications?: No    Lack of Transportation (Non-Medical): No  Physical Activity: Inactive (08/10/2023)   Exercise Vital Sign    Days of Exercise per Week: 0 days    Minutes of Exercise per Session: Not on file  Stress: Stress Concern Present (08/10/2023)   [employer] of Occupational Health - Occupational Stress Questionnaire    Feeling of Stress: To some extent  Social Connections: Moderately Integrated (08/10/2023)   Social Connection and Isolation Panel    Frequency of Communication with Friends and Family: Once a week    Frequency of Social Gatherings with Friends and Family: Once a week    Attends Religious Services: More than 4 times per year    Active Member of [employer] or Organizations: Yes    Attends Banker Meetings: Not on file    Marital Status: Married  Intimate Partner Violence: Not At Risk (03/24/2023)   Humiliation, Afraid, Rape, and Kick questionnaire    Fear of Current or Ex-Partner: No    Emotionally Abused: No    Physically Abused: No    Sexually Abused: No    Review of Systems: See HPI, otherwise negative ROS  Physical Exam: BP (!) 153/93   Pulse 94   Temp 97.9 F (36.6 C) (Temporal)   Resp 16   Ht 5' 9 (1.753 m)   Wt 80.7 kg   SpO2 100%   BMI 26.29 kg/m  General:   Alert,  pleasant and cooperative in NAD Head:  Normocephalic and atraumatic. Neck:  Supple; no masses or thyromegaly. Lungs:  Clear throughout to auscultation.    Heart:  Regular rate and rhythm. Abdomen:  Soft, nontender and nondistended. Normal bowel sounds, without guarding, and without rebound.   Neurologic:  Alert and  oriented x4;  grossly normal  neurologically.  Impression/Plan: [name] is here for an endoscopy and colonoscopy to be performed for Hx of adenomatous colon polyps, Unintentional weight loss    Risks, benefits, limitations, and alternatives regarding  endoscopy and colonoscopy have been reviewed with the patient.  Questions have been answered.  All parties agreeable.   Earl Brooklyn, MD  12/03/2023, 8:52 AM

## 2023-12-03 NOTE — Op Note (Signed)
 [facility] Patient Name: Earl Crawford Procedure Date: 12/03/2023 8:48 AM MRN: [id number] Account #: [id number] Date of Birth: 10-Jan-1951 Admit Type: Outpatient Age: 73 Room: [facility] ENDO ROOM 4 Gender: Male Note Status: Finalized Instrument Name: Colon Scope [id number] Procedure:             Colonoscopy Indications:           Surveillance: Personal history of adenomatous polyps                         on last colonoscopy > 5 years ago, Last colonoscopy:                         June 2018 Providers:             Corinn Jess Brooklyn MD, MD Referring MD:          Nancyann BRAVO. Gasper, MD (Referring MD) Medicines:             General Anesthesia Complications:         No immediate complications. Estimated blood loss: None. Procedure:             Pre-Anesthesia Assessment:                        - Prior to the procedure, a History and Physical was                         performed, and patient medications and allergies were                         reviewed. The patient is competent. The risks and                         benefits of the procedure and the sedation options and                         risks were discussed with the patient. All questions                         were answered and informed consent was obtained.                         Patient identification and proposed procedure were                         verified by the physician, the nurse, the                         anesthesiologist, the anesthetist and the technician                         in the pre-procedure area in the procedure room in the                         endoscopy suite. Mental Status Examination: alert and                         oriented. Airway Examination: normal oropharyngeal  airway and neck mobility. Respiratory Examination:                         clear to auscultation. CV Examination: normal.                         Prophylactic Antibiotics: The  patient does not require                         prophylactic antibiotics. Prior Anticoagulants: The                         patient has taken no anticoagulant or antiplatelet                         agents. ASA Grade Assessment: III - A patient with                         severe systemic disease. After reviewing the risks and                         benefits, the patient was deemed in satisfactory                         condition to undergo the procedure. The anesthesia                         plan was to use general anesthesia. Immediately prior                         to administration of medications, the patient was                         re-assessed for adequacy to receive sedatives. The                         heart rate, respiratory rate, oxygen saturations,                         blood pressure, adequacy of pulmonary ventilation, and                         response to care were monitored throughout the                         procedure. The physical status of the patient was                         re-assessed after the procedure.                        After obtaining informed consent, the colonoscope was                         passed under direct vision. Throughout the procedure,                         the patient's blood pressure, pulse, and oxygen  saturations were monitored continuously. The                         Colonoscope was introduced through the anus and                         advanced to the the cecum, identified by appendiceal                         orifice and ileocecal valve. The colonoscopy was                         performed without difficulty. The patient tolerated                         the procedure well. The quality of the bowel                         preparation was evaluated using the BBPS [facility] Bowel                         Preparation Scale) with scores of: Right Colon = 3,                         Transverse Colon = 3  and Left Colon = 3 (entire mucosa                         seen well with no residual staining, small fragments                         of stool or opaque liquid). The total BBPS score                         equals 9. The ileocecal valve, appendiceal orifice,                         and rectum were photographed. Findings:      Hemorrhoids were found on perianal exam.      A 9 mm polyp was found in the cecum. The polyp was sessile. The polyp       was removed with a hot snare. Resection and retrieval were complete.       Estimated blood loss: none.      A 5 mm polyp was found in the cecum. The polyp was sessile. The polyp       was removed with a cold snare. Resection and retrieval were complete.      Two sessile polyps were found in the ascending colon. The polyps were 4       to 5 mm in size. These polyps were removed with a cold snare. Resection       and retrieval were complete.      Three sessile polyps were found in the transverse colon. The polyps were       4 to 5 mm in size. These polyps were removed with a cold snare.       Resection and retrieval were complete.      Two sessile polyps were found in the descending colon. The polyps were 8  to 12 mm in size. These polyps were removed with a hot snare. Resection       and retrieval were complete.      Two sessile polyps were found in the descending colon. The polyps were 3       to 4 mm in size. These polyps were removed with a cold snare. Resection       and retrieval were complete. Estimated blood loss: none.      A few small-mouthed diverticula were found in the entire colon.      Non-bleeding external and internal hemorrhoids were found during       retroflexion and during perianal exam. The hemorrhoids were large. Impression:            - Hemorrhoids found on perianal exam.                        - One 9 mm polyp in the cecum, removed with a hot                         snare. Resected and retrieved.                         - One 5 mm polyp in the cecum, removed with a cold                         snare. Resected and retrieved.                        - Two 4 to 5 mm polyps in the ascending colon, removed                         with a cold snare. Resected and retrieved.                        - Three 4 to 5 mm polyps in the transverse colon,                         removed with a cold snare. Resected and retrieved.                        - Two 8 to 12 mm polyps in the descending colon,                         removed with a hot snare. Resected and retrieved.                        - Two 3 to 4 mm polyps in the descending colon,                         removed with a cold snare. Resected and retrieved.                        - Diverticulosis in the entire examined colon.                        - Non-bleeding external and internal hemorrhoids. Recommendation:        - Discharge patient to home (  with escort).                        - Resume previous diet today.                        - Continue present medications.                        - Await pathology results.                        - Repeat colonoscopy in 1 year for surveillance of                         multiple polyps.                        - Refer to a genetics counselor at appointment to be                         scheduled. Procedure Code(s):     --- Professional ---                        [phone], Colonoscopy, flexible; with removal of                         tumor(s), polyp(s), or other lesion(s) by snare                         technique Diagnosis Code(s):     --- Professional ---                        Z86.010, Personal history of colonic polyps                        D12.0, Benign neoplasm of cecum                        D12.2, Benign neoplasm of ascending colon                        D12.3, Benign neoplasm of transverse colon (hepatic                         flexure or splenic flexure)                        D12.4, Benign neoplasm of  descending colon                        K64.8, Other hemorrhoids                        K57.30, Diverticulosis of large intestine without                         perforation or abscess without bleeding CPT copyright 2022 American Medical Association. All rights reserved. The codes documented in this report are preliminary and upon coder review may  be revised to meet current compliance requirements. Dr. Corinn Brooklyn Corinn Jess Brooklyn MD, MD 12/03/2023  9:40:03 AM This report has been signed electronically. Number of Addenda: 0 Note Initiated On: 12/03/2023 8:48 AM Scope Withdrawal Time: 0 hours 22 minutes 44 seconds  Total Procedure Duration: 0 hours 28 minutes 10 seconds  Estimated Blood Loss:  Estimated blood loss: none.      [facility]

## 2023-12-03 NOTE — Op Note (Addendum)
 [facility] Patient Name: Earl Crawford Procedure Date: 12/03/2023 8:48 AM MRN: [id number] Account #: [id number] Date of Birth: [birth year] Admit Type: Outpatient Age: 73 Room: [facility] ENDO ROOM 4 Gender: Male Note Status: Supervisor Override Instrument Name: Endoscope [id number] Procedure:             Upper GI endoscopy Indications:           Weight loss, change in voice Providers:             Corinn Jess Brooklyn MD, MD Referring MD:          Nancyann BRAVO. Gasper, MD (Referring MD) Medicines:             General Anesthesia Complications:         No immediate complications. Estimated blood loss: None. Procedure:             Pre-Anesthesia Assessment:                        - Prior to the procedure, a History and Physical was                         performed, and patient medications and allergies were                         reviewed. The patient is competent. The risks and                         benefits of the procedure and the sedation options and                         risks were discussed with the patient. All questions                         were answered and informed consent was obtained.                         Patient identification and proposed procedure were                         verified by the physician, the nurse, the                         anesthesiologist, the anesthetist and the technician                         in the pre-procedure area in the procedure room in the                         endoscopy suite. Mental Status Examination: alert and                         oriented. Airway Examination: normal oropharyngeal                         airway and neck mobility. Respiratory Examination:                         clear to auscultation. CV Examination: normal.  Prophylactic Antibiotics: The patient does not require                         prophylactic antibiotics. Prior Anticoagulants: The                          patient has taken no anticoagulant or antiplatelet                         agents. ASA Grade Assessment: III - A patient with                         severe systemic disease. After reviewing the risks and                         benefits, the patient was deemed in satisfactory                         condition to undergo the procedure. The anesthesia                         plan was to use general anesthesia. Immediately prior                         to administration of medications, the patient was                         re-assessed for adequacy to receive sedatives. The                         heart rate, respiratory rate, oxygen saturations,                         blood pressure, adequacy of pulmonary ventilation, and                         response to care were monitored throughout the                         procedure. The physical status of the patient was                         re-assessed after the procedure.                        After obtaining informed consent, the endoscope was                         passed under direct vision. Throughout the procedure,                         the patient's blood pressure, pulse, and oxygen                         saturations were monitored continuously. The Endoscope                         was introduced through the mouth, and advanced to the  second part of duodenum. The upper GI endoscopy was                         accomplished without difficulty. The patient tolerated                         the procedure well. Findings:      The duodenal bulb and second portion of the duodenum were normal.       Biopsies were taken with a cold forceps for histology.      The entire examined stomach was normal. Biopsies were taken with a cold       forceps for Helicobacter pylori testing.      The cardia and gastric fundus were normal on retroflexion.      Esophagogastric landmarks were identified: the gastroesophageal  junction       was found at 40 cm from the incisors.      The gastroesophageal junction and examined esophagus were normal. Impression:            - Normal duodenal bulb and second portion of the                         duodenum. Biopsied.                        - Normal stomach. Biopsied.                        - Esophagogastric landmarks identified.                        - Normal gastroesophageal junction and esophagus. Recommendation:        - Await pathology results.                        - Proceed with colonoscopy as scheduled                        See colonoscopy report Procedure Code(s):     --- Professional ---                        [phone], Esophagogastroduodenoscopy, flexible,                         transoral; with biopsy, single or multiple Diagnosis Code(s):     --- Professional ---                        R63.4, Abnormal weight loss CPT copyright 2022 [employer]. All rights reserved. The codes documented in this report are preliminary and upon coder review may  be revised to meet current compliance requirements. Dr. Corinn Brooklyn Corinn Jess Brooklyn MD, MD 12/03/2023 9:05:36 AM This report has been signed electronically. Number of Addenda: 0 Note Initiated On: 12/03/2023 8:48 AM Estimated Blood Loss:  Estimated blood loss: none.      [facility]

## 2023-12-03 NOTE — Anesthesia Postprocedure Evaluation (Signed)
 Anesthesia Post Note  Patient: Earl Crawford  Procedure(s) Performed: COLONOSCOPY EGD (ESOPHAGOGASTRODUODENOSCOPY) POLYPECTOMY, INTESTINE  Patient location during evaluation: PACU Anesthesia Type: General Level of consciousness: awake and alert Pain management: pain level controlled Vital Signs Assessment: post-procedure vital signs reviewed and stable Respiratory status: spontaneous breathing, nonlabored ventilation and respiratory function stable Cardiovascular status: blood pressure returned to baseline and stable Postop Assessment: no apparent nausea or vomiting Anesthetic complications: no   There were no known notable events for this encounter.   Last Vitals:  Vitals:   12/03/23 0948 12/03/23 0958  BP: 128/78 (!) 145/82  Pulse: 70 68  Resp: 14 16  Temp:    SpO2: 100% 100%    Last Pain:  Vitals:   12/03/23 0958  TempSrc:   PainSc: 0-No pain                 Camellia Merilee Louder

## 2023-12-06 LAB — SURGICAL PATHOLOGY

## 2023-12-08 ENCOUNTER — Ambulatory Visit: Payer: Self-pay | Admitting: Gastroenterology

## 2023-12-08 NOTE — Progress Notes (Signed)
 Pathology results from upper endoscopy came back normal  Pathology results from colonoscopy shows multiple precancerous polyps, more than 10 in number Recommend surveillance colonoscopy in 1 year and referral to genetics  RV

## 2023-12-09 DIAGNOSIS — H903 Sensorineural hearing loss, bilateral: Secondary | ICD-10-CM | POA: Diagnosis not present

## 2023-12-09 DIAGNOSIS — H6123 Impacted cerumen, bilateral: Secondary | ICD-10-CM | POA: Diagnosis not present

## 2023-12-09 DIAGNOSIS — R682 Dry mouth, unspecified: Secondary | ICD-10-CM | POA: Diagnosis not present

## 2023-12-14 DIAGNOSIS — K625 Hemorrhage of anus and rectum: Secondary | ICD-10-CM | POA: Diagnosis not present

## 2024-03-29 ENCOUNTER — Ambulatory Visit

## 2024-08-14 ENCOUNTER — Encounter: Admitting: Family Medicine
